# Patient Record
Sex: Female | Born: 1935 | Race: White | Hispanic: No | Smoking: Never smoker
Health system: Southern US, Community
[De-identification: ages and names within clinical notes are randomized; demographics above are authoritative.]

## PROBLEM LIST (undated history)

## (undated) DIAGNOSIS — D649 Anemia, unspecified: Secondary | ICD-10-CM

## (undated) DIAGNOSIS — K219 Gastro-esophageal reflux disease without esophagitis: Secondary | ICD-10-CM

## (undated) DIAGNOSIS — E785 Hyperlipidemia, unspecified: Secondary | ICD-10-CM

## (undated) DIAGNOSIS — F0390 Unspecified dementia without behavioral disturbance: Secondary | ICD-10-CM

---

## 2004-05-31 ENCOUNTER — Ambulatory Visit: Payer: Self-pay | Admitting: Internal Medicine

## 2006-06-06 ENCOUNTER — Inpatient Hospital Stay: Payer: Self-pay | Admitting: Internal Medicine

## 2006-07-18 ENCOUNTER — Other Ambulatory Visit: Payer: Self-pay

## 2006-07-18 ENCOUNTER — Inpatient Hospital Stay: Payer: Self-pay | Admitting: Internal Medicine

## 2010-06-30 DIAGNOSIS — G309 Alzheimer's disease, unspecified: Secondary | ICD-10-CM

## 2010-06-30 DIAGNOSIS — M130 Polyarthritis, unspecified: Secondary | ICD-10-CM

## 2010-06-30 DIAGNOSIS — F0280 Dementia in other diseases classified elsewhere without behavioral disturbance: Secondary | ICD-10-CM

## 2010-06-30 DIAGNOSIS — I2510 Atherosclerotic heart disease of native coronary artery without angina pectoris: Secondary | ICD-10-CM

## 2010-06-30 DIAGNOSIS — F411 Generalized anxiety disorder: Secondary | ICD-10-CM

## 2010-09-02 DIAGNOSIS — G309 Alzheimer's disease, unspecified: Secondary | ICD-10-CM

## 2010-09-02 DIAGNOSIS — M130 Polyarthritis, unspecified: Secondary | ICD-10-CM

## 2010-09-02 DIAGNOSIS — F0280 Dementia in other diseases classified elsewhere without behavioral disturbance: Secondary | ICD-10-CM

## 2010-09-02 DIAGNOSIS — F411 Generalized anxiety disorder: Secondary | ICD-10-CM

## 2010-09-02 DIAGNOSIS — I2510 Atherosclerotic heart disease of native coronary artery without angina pectoris: Secondary | ICD-10-CM

## 2010-09-27 DIAGNOSIS — F411 Generalized anxiety disorder: Secondary | ICD-10-CM

## 2010-09-27 DIAGNOSIS — M130 Polyarthritis, unspecified: Secondary | ICD-10-CM

## 2010-09-27 DIAGNOSIS — F0280 Dementia in other diseases classified elsewhere without behavioral disturbance: Secondary | ICD-10-CM

## 2010-09-27 DIAGNOSIS — I2510 Atherosclerotic heart disease of native coronary artery without angina pectoris: Secondary | ICD-10-CM

## 2010-09-27 DIAGNOSIS — G309 Alzheimer's disease, unspecified: Secondary | ICD-10-CM

## 2010-10-11 DIAGNOSIS — J069 Acute upper respiratory infection, unspecified: Secondary | ICD-10-CM

## 2010-11-10 DIAGNOSIS — M1990 Unspecified osteoarthritis, unspecified site: Secondary | ICD-10-CM

## 2010-11-23 ENCOUNTER — Ambulatory Visit: Payer: Self-pay | Admitting: Internal Medicine

## 2010-12-09 DIAGNOSIS — F411 Generalized anxiety disorder: Secondary | ICD-10-CM

## 2010-12-09 DIAGNOSIS — M159 Polyosteoarthritis, unspecified: Secondary | ICD-10-CM

## 2010-12-09 DIAGNOSIS — G309 Alzheimer's disease, unspecified: Secondary | ICD-10-CM

## 2010-12-09 DIAGNOSIS — I2510 Atherosclerotic heart disease of native coronary artery without angina pectoris: Secondary | ICD-10-CM

## 2010-12-09 DIAGNOSIS — F0280 Dementia in other diseases classified elsewhere without behavioral disturbance: Secondary | ICD-10-CM

## 2011-01-31 DIAGNOSIS — I2510 Atherosclerotic heart disease of native coronary artery without angina pectoris: Secondary | ICD-10-CM

## 2011-01-31 DIAGNOSIS — G309 Alzheimer's disease, unspecified: Secondary | ICD-10-CM

## 2011-01-31 DIAGNOSIS — M159 Polyosteoarthritis, unspecified: Secondary | ICD-10-CM

## 2011-01-31 DIAGNOSIS — F0280 Dementia in other diseases classified elsewhere without behavioral disturbance: Secondary | ICD-10-CM

## 2011-01-31 DIAGNOSIS — F411 Generalized anxiety disorder: Secondary | ICD-10-CM

## 2011-03-24 DIAGNOSIS — F0280 Dementia in other diseases classified elsewhere without behavioral disturbance: Secondary | ICD-10-CM

## 2011-03-24 DIAGNOSIS — M159 Polyosteoarthritis, unspecified: Secondary | ICD-10-CM

## 2011-03-24 DIAGNOSIS — G309 Alzheimer's disease, unspecified: Secondary | ICD-10-CM

## 2011-03-24 DIAGNOSIS — F411 Generalized anxiety disorder: Secondary | ICD-10-CM

## 2011-03-24 DIAGNOSIS — I2510 Atherosclerotic heart disease of native coronary artery without angina pectoris: Secondary | ICD-10-CM

## 2011-06-02 DIAGNOSIS — F411 Generalized anxiety disorder: Secondary | ICD-10-CM | POA: Diagnosis not present

## 2011-06-02 DIAGNOSIS — F0280 Dementia in other diseases classified elsewhere without behavioral disturbance: Secondary | ICD-10-CM | POA: Diagnosis not present

## 2011-06-02 DIAGNOSIS — M159 Polyosteoarthritis, unspecified: Secondary | ICD-10-CM

## 2011-06-02 DIAGNOSIS — I2510 Atherosclerotic heart disease of native coronary artery without angina pectoris: Secondary | ICD-10-CM

## 2011-06-02 DIAGNOSIS — G309 Alzheimer's disease, unspecified: Secondary | ICD-10-CM

## 2011-07-25 DIAGNOSIS — I2510 Atherosclerotic heart disease of native coronary artery without angina pectoris: Secondary | ICD-10-CM

## 2011-07-25 DIAGNOSIS — M159 Polyosteoarthritis, unspecified: Secondary | ICD-10-CM | POA: Diagnosis not present

## 2011-07-25 DIAGNOSIS — F0280 Dementia in other diseases classified elsewhere without behavioral disturbance: Secondary | ICD-10-CM

## 2011-07-25 DIAGNOSIS — G309 Alzheimer's disease, unspecified: Secondary | ICD-10-CM

## 2011-07-25 DIAGNOSIS — F411 Generalized anxiety disorder: Secondary | ICD-10-CM | POA: Diagnosis not present

## 2011-08-25 DIAGNOSIS — E785 Hyperlipidemia, unspecified: Secondary | ICD-10-CM | POA: Diagnosis not present

## 2011-10-13 DIAGNOSIS — G309 Alzheimer's disease, unspecified: Secondary | ICD-10-CM | POA: Diagnosis not present

## 2011-10-13 DIAGNOSIS — M159 Polyosteoarthritis, unspecified: Secondary | ICD-10-CM | POA: Diagnosis not present

## 2011-10-13 DIAGNOSIS — F0280 Dementia in other diseases classified elsewhere without behavioral disturbance: Secondary | ICD-10-CM | POA: Diagnosis not present

## 2011-10-13 DIAGNOSIS — F411 Generalized anxiety disorder: Secondary | ICD-10-CM

## 2011-10-13 DIAGNOSIS — I2510 Atherosclerotic heart disease of native coronary artery without angina pectoris: Secondary | ICD-10-CM

## 2011-12-05 DIAGNOSIS — L03039 Cellulitis of unspecified toe: Secondary | ICD-10-CM | POA: Diagnosis not present

## 2012-01-23 DIAGNOSIS — G309 Alzheimer's disease, unspecified: Secondary | ICD-10-CM | POA: Diagnosis not present

## 2012-01-23 DIAGNOSIS — F0280 Dementia in other diseases classified elsewhere without behavioral disturbance: Secondary | ICD-10-CM | POA: Diagnosis not present

## 2012-01-23 DIAGNOSIS — I2510 Atherosclerotic heart disease of native coronary artery without angina pectoris: Secondary | ICD-10-CM | POA: Diagnosis not present

## 2012-01-23 DIAGNOSIS — M159 Polyosteoarthritis, unspecified: Secondary | ICD-10-CM | POA: Diagnosis not present

## 2012-01-23 DIAGNOSIS — F411 Generalized anxiety disorder: Secondary | ICD-10-CM

## 2012-02-16 DIAGNOSIS — R300 Dysuria: Secondary | ICD-10-CM | POA: Diagnosis not present

## 2012-02-16 DIAGNOSIS — R4182 Altered mental status, unspecified: Secondary | ICD-10-CM | POA: Diagnosis not present

## 2012-02-23 DIAGNOSIS — E785 Hyperlipidemia, unspecified: Secondary | ICD-10-CM | POA: Diagnosis not present

## 2012-02-23 DIAGNOSIS — D649 Anemia, unspecified: Secondary | ICD-10-CM | POA: Diagnosis not present

## 2012-04-05 DIAGNOSIS — G309 Alzheimer's disease, unspecified: Secondary | ICD-10-CM | POA: Diagnosis not present

## 2012-04-05 DIAGNOSIS — M159 Polyosteoarthritis, unspecified: Secondary | ICD-10-CM

## 2012-04-05 DIAGNOSIS — F411 Generalized anxiety disorder: Secondary | ICD-10-CM | POA: Diagnosis not present

## 2012-04-05 DIAGNOSIS — I2510 Atherosclerotic heart disease of native coronary artery without angina pectoris: Secondary | ICD-10-CM | POA: Diagnosis not present

## 2012-04-05 DIAGNOSIS — F0280 Dementia in other diseases classified elsewhere without behavioral disturbance: Secondary | ICD-10-CM

## 2012-04-23 ENCOUNTER — Telehealth: Payer: Self-pay | Admitting: Internal Medicine

## 2012-04-23 NOTE — Telephone Encounter (Signed)
[facility] Triage Call Report Triage Record Num: [id number] Operator: Connye Burkitt Patient Name: Morgan Escobar Call Date & Time: 04/21/2012 11:15:03AM Patient Phone: [phone] PCP: Tillman Abide Patient Gender: Female PCP Fax : [phone] Patient DOB: 1927/08/06 Practice Name: [facility] Reason for Call: Caller: Teresa/RN; PCP: Tillman Abide (Family Practice); CB#: [phone]; Call regarding Cough; Afebrile. Lungs bilaterally have "rub sound" top to bottom, afebrile, pulse ox 97%, no o2. ADL's not effected. Cough intermittent and wet. Last Sunday, coughed hard and was not eating at the time, hoarse for about 2 hours. Protocol(s) Used: Breathing Problems Protocol(s) Used: Cough - Adult Recommended Outcome per Protocol: See Provider within 24 hours Reason for Outcome: Cough without breathing difficulty Gradual onset of cough when lying down AND relieved after being in a sitting or standing position Care Advice: ~ 04/21/2012 11:41:38AM Page 1 of 1 CAN_TriageRpt_V2

## 2012-04-23 NOTE — Telephone Encounter (Signed)
[facility] Triage Call Report Triage Record Num: [id number] Operator: Connye Burkitt Patient Name: Morgan Escobar Call Date & Time: 04/21/2012 11:50:21AM Patient Phone: [phone] PCP: Tillman Abide Patient Gender: Female PCP Fax : [phone] Patient DOB: Sep 15, 1927 Practice Name[facility] Reason for Call: Caller: Teresa/RN; PCP: Tillman Abide (Family Practice); CB#: [phone]; Call regarding Cough; Addendum to earlier 04/21/2012 call @ 11:15, computer issue and RN/CAN was knocked off the system and needed IT help from [employer]. Back on line, tried Calling 2x to continue triage and also tried [phone], they advised trying [phone], reached Aggie Cosier, RN successfully. All emergent signs and symptoms of cough - Adult protocal ruled out except 'Recurring cough AND known or suspected gastric reflux (heartburn, sour or acid fluid or food from stomach into throat/mouth'. Home Care advice given and instructed Aggie Cosier, RN to call back with any respiratory issues, she stated she will and Dr. will be into see her Monday, 04/23/2012. Protocol(s) Used: Cough - Adult Recommended Outcome per Protocol: See Provider within 72 Hours Reason for Outcome: Recurring cough AND known or suspected gastric reflux (heartburn, sour or acid fluid or food from stomach into throat/mouth) Care Advice: ~ Avoid fatty, fried, spicy, or gas-producing foods. ~ Avoid drinking alcohol. ~ Avoid eating salty, spicy, or acidic foods. ~ Eat smaller, more frequent meals; eat slowly and allow one-half hour to relax after eating. ~ If you can, stop smoking now and avoid all secondhand smoke. ~ Call provider if symptoms continue despite home care. ~ SYMPTOM / CONDITION MANAGEMENT ~ NUTRITION / HYDRATION ~ CAUTIONS Consider nonprescription low-sodium antacids (i.e. Mylanta, Maalox, Tums, Gelusil), H-2-receptor blockers (Tagamet HB, Pepcid AC, Zantac), or a proton pump inhibitor (Prilosec) following  package directions or provider instructions. ~ 04/21/2012 12:04:47PM Page 1 of 1 CAN_TriageRpt_V2

## 2012-04-23 NOTE — Telephone Encounter (Signed)
Reviewed at [facility] today She is doing better

## 2012-05-24 DIAGNOSIS — M159 Polyosteoarthritis, unspecified: Secondary | ICD-10-CM | POA: Diagnosis not present

## 2012-05-24 DIAGNOSIS — G309 Alzheimer's disease, unspecified: Secondary | ICD-10-CM

## 2012-05-24 DIAGNOSIS — I2510 Atherosclerotic heart disease of native coronary artery without angina pectoris: Secondary | ICD-10-CM

## 2012-05-24 DIAGNOSIS — F411 Generalized anxiety disorder: Secondary | ICD-10-CM | POA: Diagnosis not present

## 2012-05-24 DIAGNOSIS — F0280 Dementia in other diseases classified elsewhere without behavioral disturbance: Secondary | ICD-10-CM | POA: Diagnosis not present

## 2012-07-19 DIAGNOSIS — I2510 Atherosclerotic heart disease of native coronary artery without angina pectoris: Secondary | ICD-10-CM | POA: Diagnosis not present

## 2012-07-19 DIAGNOSIS — M159 Polyosteoarthritis, unspecified: Secondary | ICD-10-CM

## 2012-07-19 DIAGNOSIS — F0280 Dementia in other diseases classified elsewhere without behavioral disturbance: Secondary | ICD-10-CM | POA: Diagnosis not present

## 2012-07-19 DIAGNOSIS — G309 Alzheimer's disease, unspecified: Secondary | ICD-10-CM | POA: Diagnosis not present

## 2012-07-19 DIAGNOSIS — F411 Generalized anxiety disorder: Secondary | ICD-10-CM | POA: Diagnosis not present

## 2012-08-23 DIAGNOSIS — E785 Hyperlipidemia, unspecified: Secondary | ICD-10-CM | POA: Diagnosis not present

## 2012-09-27 DIAGNOSIS — I2510 Atherosclerotic heart disease of native coronary artery without angina pectoris: Secondary | ICD-10-CM

## 2012-09-27 DIAGNOSIS — F411 Generalized anxiety disorder: Secondary | ICD-10-CM

## 2012-09-27 DIAGNOSIS — F0280 Dementia in other diseases classified elsewhere without behavioral disturbance: Secondary | ICD-10-CM | POA: Diagnosis not present

## 2012-09-27 DIAGNOSIS — G309 Alzheimer's disease, unspecified: Secondary | ICD-10-CM

## 2012-09-27 DIAGNOSIS — M159 Polyosteoarthritis, unspecified: Secondary | ICD-10-CM | POA: Diagnosis not present

## 2012-11-29 DIAGNOSIS — F411 Generalized anxiety disorder: Secondary | ICD-10-CM

## 2012-11-29 DIAGNOSIS — G309 Alzheimer's disease, unspecified: Secondary | ICD-10-CM | POA: Diagnosis not present

## 2012-11-29 DIAGNOSIS — F0280 Dementia in other diseases classified elsewhere without behavioral disturbance: Secondary | ICD-10-CM

## 2012-11-29 DIAGNOSIS — S60229A Contusion of unspecified hand, initial encounter: Secondary | ICD-10-CM | POA: Diagnosis not present

## 2012-11-29 DIAGNOSIS — M159 Polyosteoarthritis, unspecified: Secondary | ICD-10-CM | POA: Diagnosis not present

## 2013-01-23 DIAGNOSIS — F0280 Dementia in other diseases classified elsewhere without behavioral disturbance: Secondary | ICD-10-CM

## 2013-01-23 DIAGNOSIS — I2510 Atherosclerotic heart disease of native coronary artery without angina pectoris: Secondary | ICD-10-CM

## 2013-01-23 DIAGNOSIS — M1990 Unspecified osteoarthritis, unspecified site: Secondary | ICD-10-CM

## 2013-01-23 DIAGNOSIS — G309 Alzheimer's disease, unspecified: Secondary | ICD-10-CM

## 2013-01-23 DIAGNOSIS — K5900 Constipation, unspecified: Secondary | ICD-10-CM

## 2013-01-23 DIAGNOSIS — F3289 Other specified depressive episodes: Secondary | ICD-10-CM

## 2013-01-23 DIAGNOSIS — F411 Generalized anxiety disorder: Secondary | ICD-10-CM

## 2013-01-23 DIAGNOSIS — F329 Major depressive disorder, single episode, unspecified: Secondary | ICD-10-CM | POA: Diagnosis not present

## 2013-02-28 DIAGNOSIS — D649 Anemia, unspecified: Secondary | ICD-10-CM | POA: Diagnosis not present

## 2013-02-28 DIAGNOSIS — E785 Hyperlipidemia, unspecified: Secondary | ICD-10-CM | POA: Diagnosis not present

## 2013-03-27 DIAGNOSIS — F411 Generalized anxiety disorder: Secondary | ICD-10-CM | POA: Diagnosis not present

## 2013-03-27 DIAGNOSIS — G309 Alzheimer's disease, unspecified: Secondary | ICD-10-CM

## 2013-03-27 DIAGNOSIS — I2510 Atherosclerotic heart disease of native coronary artery without angina pectoris: Secondary | ICD-10-CM

## 2013-03-27 DIAGNOSIS — F0280 Dementia in other diseases classified elsewhere without behavioral disturbance: Secondary | ICD-10-CM | POA: Diagnosis not present

## 2013-03-27 DIAGNOSIS — M1710 Unilateral primary osteoarthritis, unspecified knee: Secondary | ICD-10-CM | POA: Diagnosis not present

## 2013-03-27 DIAGNOSIS — K5900 Constipation, unspecified: Secondary | ICD-10-CM

## 2013-05-29 DIAGNOSIS — F411 Generalized anxiety disorder: Secondary | ICD-10-CM | POA: Diagnosis not present

## 2013-05-29 DIAGNOSIS — I2510 Atherosclerotic heart disease of native coronary artery without angina pectoris: Secondary | ICD-10-CM

## 2013-05-29 DIAGNOSIS — IMO0002 Reserved for concepts with insufficient information to code with codable children: Secondary | ICD-10-CM

## 2013-05-29 DIAGNOSIS — M1710 Unilateral primary osteoarthritis, unspecified knee: Secondary | ICD-10-CM

## 2013-05-29 DIAGNOSIS — K5900 Constipation, unspecified: Secondary | ICD-10-CM

## 2013-05-29 DIAGNOSIS — G309 Alzheimer's disease, unspecified: Secondary | ICD-10-CM

## 2013-05-29 DIAGNOSIS — F0280 Dementia in other diseases classified elsewhere without behavioral disturbance: Secondary | ICD-10-CM | POA: Diagnosis not present

## 2013-07-23 DIAGNOSIS — IMO0002 Reserved for concepts with insufficient information to code with codable children: Secondary | ICD-10-CM | POA: Diagnosis not present

## 2013-07-23 DIAGNOSIS — K5900 Constipation, unspecified: Secondary | ICD-10-CM | POA: Diagnosis not present

## 2013-07-23 DIAGNOSIS — M1710 Unilateral primary osteoarthritis, unspecified knee: Secondary | ICD-10-CM | POA: Diagnosis not present

## 2013-07-23 DIAGNOSIS — G309 Alzheimer's disease, unspecified: Secondary | ICD-10-CM

## 2013-07-23 DIAGNOSIS — F411 Generalized anxiety disorder: Secondary | ICD-10-CM

## 2013-07-23 DIAGNOSIS — F0280 Dementia in other diseases classified elsewhere without behavioral disturbance: Secondary | ICD-10-CM | POA: Diagnosis not present

## 2013-07-23 DIAGNOSIS — I2510 Atherosclerotic heart disease of native coronary artery without angina pectoris: Secondary | ICD-10-CM

## 2013-08-16 DIAGNOSIS — M1990 Unspecified osteoarthritis, unspecified site: Secondary | ICD-10-CM | POA: Diagnosis not present

## 2013-08-19 DIAGNOSIS — E785 Hyperlipidemia, unspecified: Secondary | ICD-10-CM | POA: Diagnosis not present

## 2013-09-26 DIAGNOSIS — Z79899 Other long term (current) drug therapy: Secondary | ICD-10-CM | POA: Diagnosis not present

## 2013-09-27 DIAGNOSIS — M1990 Unspecified osteoarthritis, unspecified site: Secondary | ICD-10-CM | POA: Diagnosis not present

## 2013-09-27 DIAGNOSIS — D649 Anemia, unspecified: Secondary | ICD-10-CM | POA: Diagnosis not present

## 2013-10-01 DIAGNOSIS — F0280 Dementia in other diseases classified elsewhere without behavioral disturbance: Secondary | ICD-10-CM

## 2013-10-01 DIAGNOSIS — IMO0002 Reserved for concepts with insufficient information to code with codable children: Secondary | ICD-10-CM

## 2013-10-01 DIAGNOSIS — R609 Edema, unspecified: Secondary | ICD-10-CM | POA: Diagnosis not present

## 2013-10-01 DIAGNOSIS — I2510 Atherosclerotic heart disease of native coronary artery without angina pectoris: Secondary | ICD-10-CM | POA: Diagnosis not present

## 2013-10-01 DIAGNOSIS — G309 Alzheimer's disease, unspecified: Secondary | ICD-10-CM

## 2013-10-01 DIAGNOSIS — R110 Nausea: Secondary | ICD-10-CM | POA: Diagnosis not present

## 2013-10-01 DIAGNOSIS — M1710 Unilateral primary osteoarthritis, unspecified knee: Secondary | ICD-10-CM | POA: Diagnosis not present

## 2013-10-01 DIAGNOSIS — F411 Generalized anxiety disorder: Secondary | ICD-10-CM

## 2013-10-07 DIAGNOSIS — D649 Anemia, unspecified: Secondary | ICD-10-CM | POA: Diagnosis not present

## 2013-10-09 DIAGNOSIS — D500 Iron deficiency anemia secondary to blood loss (chronic): Secondary | ICD-10-CM

## 2013-11-18 DIAGNOSIS — Z79899 Other long term (current) drug therapy: Secondary | ICD-10-CM | POA: Diagnosis not present

## 2013-11-27 DIAGNOSIS — F0280 Dementia in other diseases classified elsewhere without behavioral disturbance: Secondary | ICD-10-CM

## 2013-11-27 DIAGNOSIS — R1110 Vomiting, unspecified: Secondary | ICD-10-CM | POA: Diagnosis not present

## 2013-11-27 DIAGNOSIS — K5900 Constipation, unspecified: Secondary | ICD-10-CM | POA: Diagnosis not present

## 2013-11-27 DIAGNOSIS — G309 Alzheimer's disease, unspecified: Secondary | ICD-10-CM

## 2013-11-27 DIAGNOSIS — F411 Generalized anxiety disorder: Secondary | ICD-10-CM

## 2013-11-27 DIAGNOSIS — IMO0002 Reserved for concepts with insufficient information to code with codable children: Secondary | ICD-10-CM | POA: Diagnosis not present

## 2013-11-27 DIAGNOSIS — M1710 Unilateral primary osteoarthritis, unspecified knee: Secondary | ICD-10-CM

## 2013-11-27 DIAGNOSIS — I2510 Atherosclerotic heart disease of native coronary artery without angina pectoris: Secondary | ICD-10-CM | POA: Diagnosis not present

## 2014-01-18 ENCOUNTER — Emergency Department: Payer: Self-pay | Admitting: Emergency Medicine

## 2014-01-18 DIAGNOSIS — R531 Weakness: Secondary | ICD-10-CM | POA: Diagnosis not present

## 2014-01-18 DIAGNOSIS — F0390 Unspecified dementia without behavioral disturbance: Secondary | ICD-10-CM | POA: Diagnosis not present

## 2014-01-18 DIAGNOSIS — J918 Pleural effusion in other conditions classified elsewhere: Secondary | ICD-10-CM | POA: Diagnosis not present

## 2014-01-18 DIAGNOSIS — R9431 Abnormal electrocardiogram [ECG] [EKG]: Secondary | ICD-10-CM | POA: Diagnosis not present

## 2014-01-18 DIAGNOSIS — N390 Urinary tract infection, site not specified: Secondary | ICD-10-CM | POA: Diagnosis not present

## 2014-01-18 LAB — URINALYSIS, COMPLETE
GLUCOSE, UR: NEGATIVE mg/dL (ref 0–75)
Hyaline Cast: 31
NITRITE: NEGATIVE
Ph: 5 (ref 4.5–8.0)
RBC,UR: 14 /HPF (ref 0–5)
SPECIFIC GRAVITY: 1.025 (ref 1.003–1.030)
Squamous Epithelial: 5
WBC UR: 276 /HPF (ref 0–5)

## 2014-01-18 LAB — COMPREHENSIVE METABOLIC PANEL
ALK PHOS: 201 U/L — AB
ALT: 31 U/L
Albumin: 3.2 g/dL — ABNORMAL LOW (ref 3.4–5.0)
Anion Gap: 8 (ref 7–16)
BILIRUBIN TOTAL: 0.3 mg/dL (ref 0.2–1.0)
BUN: 15 mg/dL (ref 7–18)
CHLORIDE: 106 mmol/L (ref 98–107)
Calcium, Total: 8.4 mg/dL — ABNORMAL LOW (ref 8.5–10.1)
Co2: 26 mmol/L (ref 21–32)
Creatinine: 1.34 mg/dL — ABNORMAL HIGH (ref 0.60–1.30)
GFR CALC AF AMER: 49 — AB
GFR CALC NON AF AMER: 40 — AB
Glucose: 96 mg/dL (ref 65–99)
Osmolality: 280 (ref 275–301)
Potassium: 4.1 mmol/L (ref 3.5–5.1)
SGOT(AST): 32 U/L (ref 15–37)
Sodium: 140 mmol/L (ref 136–145)
TOTAL PROTEIN: 7.4 g/dL (ref 6.4–8.2)

## 2014-01-18 LAB — CBC WITH DIFFERENTIAL/PLATELET
BASOS ABS: 0 10*3/uL (ref 0.0–0.1)
BASOS PCT: 0.5 %
EOS ABS: 0.1 10*3/uL (ref 0.0–0.7)
Eosinophil %: 1.7 %
HCT: 41.5 % (ref 35.0–47.0)
HGB: 12.9 g/dL (ref 12.0–16.0)
LYMPHS ABS: 0.9 10*3/uL — AB (ref 1.0–3.6)
Lymphocyte %: 11.1 %
MCH: 27 pg (ref 26.0–34.0)
MCHC: 31.2 g/dL — ABNORMAL LOW (ref 32.0–36.0)
MCV: 87 fL (ref 80–100)
MONOS PCT: 8.5 %
Monocyte #: 0.7 x10 3/mm (ref 0.2–0.9)
NEUTROS ABS: 6.4 10*3/uL (ref 1.4–6.5)
Neutrophil %: 78.2 %
PLATELETS: 216 10*3/uL (ref 150–440)
RBC: 4.79 10*6/uL (ref 3.80–5.20)
RDW: 15.3 % — ABNORMAL HIGH (ref 11.5–14.5)
WBC: 8.2 10*3/uL (ref 3.6–11.0)

## 2014-01-18 LAB — PRO B NATRIURETIC PEPTIDE: B-TYPE NATIURETIC PEPTID: 852 pg/mL — AB (ref 0–450)

## 2014-01-18 LAB — TROPONIN I: Troponin-I: <0.02 ng/mL

## 2014-01-20 LAB — URINE CULTURE

## 2014-01-27 DIAGNOSIS — M1990 Unspecified osteoarthritis, unspecified site: Secondary | ICD-10-CM | POA: Diagnosis not present

## 2014-01-27 DIAGNOSIS — K5900 Constipation, unspecified: Secondary | ICD-10-CM | POA: Diagnosis not present

## 2014-01-27 DIAGNOSIS — R110 Nausea: Secondary | ICD-10-CM

## 2014-01-27 DIAGNOSIS — F419 Anxiety disorder, unspecified: Secondary | ICD-10-CM

## 2014-01-27 DIAGNOSIS — I2510 Atherosclerotic heart disease of native coronary artery without angina pectoris: Secondary | ICD-10-CM | POA: Diagnosis not present

## 2014-01-27 DIAGNOSIS — G309 Alzheimer's disease, unspecified: Secondary | ICD-10-CM

## 2014-02-17 DIAGNOSIS — E785 Hyperlipidemia, unspecified: Secondary | ICD-10-CM | POA: Diagnosis not present

## 2014-02-17 DIAGNOSIS — D649 Anemia, unspecified: Secondary | ICD-10-CM | POA: Diagnosis not present

## 2014-02-25 DIAGNOSIS — M6281 Muscle weakness (generalized): Secondary | ICD-10-CM | POA: Diagnosis not present

## 2014-02-26 DIAGNOSIS — M6281 Muscle weakness (generalized): Secondary | ICD-10-CM | POA: Diagnosis not present

## 2014-02-28 DIAGNOSIS — M6281 Muscle weakness (generalized): Secondary | ICD-10-CM | POA: Diagnosis not present

## 2014-03-04 DIAGNOSIS — M6281 Muscle weakness (generalized): Secondary | ICD-10-CM | POA: Diagnosis not present

## 2014-03-05 DIAGNOSIS — M6281 Muscle weakness (generalized): Secondary | ICD-10-CM | POA: Diagnosis not present

## 2014-03-07 DIAGNOSIS — M6281 Muscle weakness (generalized): Secondary | ICD-10-CM | POA: Diagnosis not present

## 2014-03-11 DIAGNOSIS — M6281 Muscle weakness (generalized): Secondary | ICD-10-CM | POA: Diagnosis not present

## 2014-03-12 DIAGNOSIS — M6281 Muscle weakness (generalized): Secondary | ICD-10-CM | POA: Diagnosis not present

## 2014-03-14 DIAGNOSIS — M6281 Muscle weakness (generalized): Secondary | ICD-10-CM | POA: Diagnosis not present

## 2014-03-19 DIAGNOSIS — M6281 Muscle weakness (generalized): Secondary | ICD-10-CM | POA: Diagnosis not present

## 2014-03-20 DIAGNOSIS — M6281 Muscle weakness (generalized): Secondary | ICD-10-CM | POA: Diagnosis not present

## 2014-03-26 DIAGNOSIS — G309 Alzheimer's disease, unspecified: Secondary | ICD-10-CM

## 2014-03-26 DIAGNOSIS — K219 Gastro-esophageal reflux disease without esophagitis: Secondary | ICD-10-CM

## 2014-03-26 DIAGNOSIS — K5900 Constipation, unspecified: Secondary | ICD-10-CM | POA: Diagnosis not present

## 2014-03-26 DIAGNOSIS — I2510 Atherosclerotic heart disease of native coronary artery without angina pectoris: Secondary | ICD-10-CM | POA: Diagnosis not present

## 2014-03-26 DIAGNOSIS — M1990 Unspecified osteoarthritis, unspecified site: Secondary | ICD-10-CM | POA: Diagnosis not present

## 2014-03-26 DIAGNOSIS — F419 Anxiety disorder, unspecified: Secondary | ICD-10-CM | POA: Diagnosis not present

## 2014-05-27 DIAGNOSIS — M6281 Muscle weakness (generalized): Secondary | ICD-10-CM | POA: Diagnosis not present

## 2014-05-27 DIAGNOSIS — R262 Difficulty in walking, not elsewhere classified: Secondary | ICD-10-CM | POA: Diagnosis not present

## 2014-05-28 DIAGNOSIS — R262 Difficulty in walking, not elsewhere classified: Secondary | ICD-10-CM | POA: Diagnosis not present

## 2014-05-28 DIAGNOSIS — M6281 Muscle weakness (generalized): Secondary | ICD-10-CM | POA: Diagnosis not present

## 2014-05-29 DIAGNOSIS — M6281 Muscle weakness (generalized): Secondary | ICD-10-CM | POA: Diagnosis not present

## 2014-05-29 DIAGNOSIS — R262 Difficulty in walking, not elsewhere classified: Secondary | ICD-10-CM | POA: Diagnosis not present

## 2014-05-30 DIAGNOSIS — R262 Difficulty in walking, not elsewhere classified: Secondary | ICD-10-CM | POA: Diagnosis not present

## 2014-05-30 DIAGNOSIS — M6281 Muscle weakness (generalized): Secondary | ICD-10-CM | POA: Diagnosis not present

## 2014-06-02 DIAGNOSIS — R262 Difficulty in walking, not elsewhere classified: Secondary | ICD-10-CM | POA: Diagnosis not present

## 2014-06-02 DIAGNOSIS — M6281 Muscle weakness (generalized): Secondary | ICD-10-CM | POA: Diagnosis not present

## 2014-06-03 DIAGNOSIS — R262 Difficulty in walking, not elsewhere classified: Secondary | ICD-10-CM | POA: Diagnosis not present

## 2014-06-03 DIAGNOSIS — M6281 Muscle weakness (generalized): Secondary | ICD-10-CM | POA: Diagnosis not present

## 2014-06-04 DIAGNOSIS — I2510 Atherosclerotic heart disease of native coronary artery without angina pectoris: Secondary | ICD-10-CM | POA: Diagnosis not present

## 2014-06-04 DIAGNOSIS — M1990 Unspecified osteoarthritis, unspecified site: Secondary | ICD-10-CM

## 2014-06-04 DIAGNOSIS — R262 Difficulty in walking, not elsewhere classified: Secondary | ICD-10-CM | POA: Diagnosis not present

## 2014-06-04 DIAGNOSIS — K5900 Constipation, unspecified: Secondary | ICD-10-CM | POA: Diagnosis not present

## 2014-06-04 DIAGNOSIS — G309 Alzheimer's disease, unspecified: Secondary | ICD-10-CM | POA: Diagnosis not present

## 2014-06-04 DIAGNOSIS — M6281 Muscle weakness (generalized): Secondary | ICD-10-CM | POA: Diagnosis not present

## 2014-06-04 DIAGNOSIS — K219 Gastro-esophageal reflux disease without esophagitis: Secondary | ICD-10-CM | POA: Diagnosis not present

## 2014-06-04 DIAGNOSIS — F419 Anxiety disorder, unspecified: Secondary | ICD-10-CM | POA: Diagnosis not present

## 2014-06-05 DIAGNOSIS — R262 Difficulty in walking, not elsewhere classified: Secondary | ICD-10-CM | POA: Diagnosis not present

## 2014-06-05 DIAGNOSIS — M6281 Muscle weakness (generalized): Secondary | ICD-10-CM | POA: Diagnosis not present

## 2014-06-06 DIAGNOSIS — R262 Difficulty in walking, not elsewhere classified: Secondary | ICD-10-CM | POA: Diagnosis not present

## 2014-06-06 DIAGNOSIS — M6281 Muscle weakness (generalized): Secondary | ICD-10-CM | POA: Diagnosis not present

## 2014-06-09 DIAGNOSIS — R262 Difficulty in walking, not elsewhere classified: Secondary | ICD-10-CM | POA: Diagnosis not present

## 2014-06-09 DIAGNOSIS — M6281 Muscle weakness (generalized): Secondary | ICD-10-CM | POA: Diagnosis not present

## 2014-06-10 DIAGNOSIS — M6281 Muscle weakness (generalized): Secondary | ICD-10-CM | POA: Diagnosis not present

## 2014-06-10 DIAGNOSIS — R262 Difficulty in walking, not elsewhere classified: Secondary | ICD-10-CM | POA: Diagnosis not present

## 2014-06-11 DIAGNOSIS — R262 Difficulty in walking, not elsewhere classified: Secondary | ICD-10-CM | POA: Diagnosis not present

## 2014-06-11 DIAGNOSIS — M6281 Muscle weakness (generalized): Secondary | ICD-10-CM | POA: Diagnosis not present

## 2014-06-12 DIAGNOSIS — M6281 Muscle weakness (generalized): Secondary | ICD-10-CM | POA: Diagnosis not present

## 2014-06-12 DIAGNOSIS — R262 Difficulty in walking, not elsewhere classified: Secondary | ICD-10-CM | POA: Diagnosis not present

## 2014-06-13 DIAGNOSIS — R262 Difficulty in walking, not elsewhere classified: Secondary | ICD-10-CM | POA: Diagnosis not present

## 2014-06-13 DIAGNOSIS — M6281 Muscle weakness (generalized): Secondary | ICD-10-CM | POA: Diagnosis not present

## 2014-08-08 DIAGNOSIS — K219 Gastro-esophageal reflux disease without esophagitis: Secondary | ICD-10-CM | POA: Diagnosis not present

## 2014-08-08 DIAGNOSIS — G309 Alzheimer's disease, unspecified: Secondary | ICD-10-CM

## 2014-08-08 DIAGNOSIS — K5900 Constipation, unspecified: Secondary | ICD-10-CM | POA: Diagnosis not present

## 2014-08-08 DIAGNOSIS — F419 Anxiety disorder, unspecified: Secondary | ICD-10-CM

## 2014-08-08 DIAGNOSIS — I2510 Atherosclerotic heart disease of native coronary artery without angina pectoris: Secondary | ICD-10-CM | POA: Diagnosis not present

## 2014-08-08 DIAGNOSIS — M1990 Unspecified osteoarthritis, unspecified site: Secondary | ICD-10-CM | POA: Diagnosis not present

## 2014-08-18 DIAGNOSIS — E785 Hyperlipidemia, unspecified: Secondary | ICD-10-CM | POA: Diagnosis not present

## 2014-09-19 DIAGNOSIS — F419 Anxiety disorder, unspecified: Secondary | ICD-10-CM

## 2014-09-19 DIAGNOSIS — I2510 Atherosclerotic heart disease of native coronary artery without angina pectoris: Secondary | ICD-10-CM | POA: Diagnosis not present

## 2014-09-19 DIAGNOSIS — K5900 Constipation, unspecified: Secondary | ICD-10-CM

## 2014-09-19 DIAGNOSIS — M1990 Unspecified osteoarthritis, unspecified site: Secondary | ICD-10-CM | POA: Diagnosis not present

## 2014-09-19 DIAGNOSIS — G309 Alzheimer's disease, unspecified: Secondary | ICD-10-CM | POA: Diagnosis not present

## 2014-09-19 DIAGNOSIS — K219 Gastro-esophageal reflux disease without esophagitis: Secondary | ICD-10-CM | POA: Diagnosis not present

## 2014-11-26 DIAGNOSIS — M1990 Unspecified osteoarthritis, unspecified site: Secondary | ICD-10-CM | POA: Diagnosis not present

## 2014-11-26 DIAGNOSIS — K219 Gastro-esophageal reflux disease without esophagitis: Secondary | ICD-10-CM | POA: Diagnosis not present

## 2014-11-26 DIAGNOSIS — F419 Anxiety disorder, unspecified: Secondary | ICD-10-CM

## 2014-11-26 DIAGNOSIS — G309 Alzheimer's disease, unspecified: Secondary | ICD-10-CM

## 2014-11-26 DIAGNOSIS — I2510 Atherosclerotic heart disease of native coronary artery without angina pectoris: Secondary | ICD-10-CM | POA: Diagnosis not present

## 2014-11-26 DIAGNOSIS — K5900 Constipation, unspecified: Secondary | ICD-10-CM | POA: Diagnosis not present

## 2015-01-28 DIAGNOSIS — K219 Gastro-esophageal reflux disease without esophagitis: Secondary | ICD-10-CM | POA: Diagnosis not present

## 2015-01-28 DIAGNOSIS — I2510 Atherosclerotic heart disease of native coronary artery without angina pectoris: Secondary | ICD-10-CM | POA: Diagnosis not present

## 2015-01-28 DIAGNOSIS — M1990 Unspecified osteoarthritis, unspecified site: Secondary | ICD-10-CM | POA: Diagnosis not present

## 2015-01-28 DIAGNOSIS — G309 Alzheimer's disease, unspecified: Secondary | ICD-10-CM

## 2015-01-28 DIAGNOSIS — F419 Anxiety disorder, unspecified: Secondary | ICD-10-CM

## 2015-01-28 DIAGNOSIS — K5900 Constipation, unspecified: Secondary | ICD-10-CM | POA: Diagnosis not present

## 2015-02-23 DIAGNOSIS — E785 Hyperlipidemia, unspecified: Secondary | ICD-10-CM | POA: Diagnosis not present

## 2015-02-23 DIAGNOSIS — D649 Anemia, unspecified: Secondary | ICD-10-CM | POA: Diagnosis not present

## 2015-03-27 DIAGNOSIS — F419 Anxiety disorder, unspecified: Secondary | ICD-10-CM

## 2015-03-27 DIAGNOSIS — K5900 Constipation, unspecified: Secondary | ICD-10-CM

## 2015-03-27 DIAGNOSIS — K219 Gastro-esophageal reflux disease without esophagitis: Secondary | ICD-10-CM | POA: Diagnosis not present

## 2015-03-27 DIAGNOSIS — I2510 Atherosclerotic heart disease of native coronary artery without angina pectoris: Secondary | ICD-10-CM | POA: Diagnosis not present

## 2015-03-27 DIAGNOSIS — G309 Alzheimer's disease, unspecified: Secondary | ICD-10-CM | POA: Diagnosis not present

## 2015-03-27 DIAGNOSIS — E785 Hyperlipidemia, unspecified: Secondary | ICD-10-CM

## 2015-03-27 DIAGNOSIS — M1990 Unspecified osteoarthritis, unspecified site: Secondary | ICD-10-CM | POA: Diagnosis not present

## 2015-05-20 DIAGNOSIS — M1990 Unspecified osteoarthritis, unspecified site: Secondary | ICD-10-CM | POA: Diagnosis not present

## 2015-05-20 DIAGNOSIS — I2510 Atherosclerotic heart disease of native coronary artery without angina pectoris: Secondary | ICD-10-CM | POA: Diagnosis not present

## 2015-05-20 DIAGNOSIS — F419 Anxiety disorder, unspecified: Secondary | ICD-10-CM

## 2015-05-20 DIAGNOSIS — G309 Alzheimer's disease, unspecified: Secondary | ICD-10-CM | POA: Diagnosis not present

## 2015-05-20 DIAGNOSIS — K5900 Constipation, unspecified: Secondary | ICD-10-CM | POA: Diagnosis not present

## 2015-05-20 DIAGNOSIS — K219 Gastro-esophageal reflux disease without esophagitis: Secondary | ICD-10-CM

## 2015-06-08 ENCOUNTER — Telehealth: Payer: Self-pay

## 2015-06-08 NOTE — Telephone Encounter (Signed)
Treated empirically with Z-pak and flu Rx She is better today

## 2015-06-08 NOTE — Telephone Encounter (Signed)
PLEASE NOTE: All timestamps contained within this report are represented as [place] Standard Time. CONFIDENTIALTY NOTICE: This fax transmission is intended only for the addressee. It contains information that is legally privileged, confidential or otherwise protected from use or disclosure. If you are not the intended recipient, you are strictly prohibited from reviewing, disclosing, copying using or disseminating any of this information or taking any action in reliance on or regarding this information. If you have received this fax in error, please notify us immediately by telephone so that we can arrange for its return to [place]. Phone: [phone], Toll-Free: [phone], Fax: [phone] Page: 1 of 1 Call Id: [id number] Dozier Patient Name: Morgan Escobar Gender: Female DOB: [birth year] Age: 82 Y 3 M 13 D Return Phone Number: Address: City/State/Zip: [place] Client [place] Night - Client Client Site [place] Physician Viviana Simpler Contact Type Call Call Type Page Only Caller Name Helene Kelp w/ Memory Care at [facility] Relationship To Patient Provider Is this call to report lab results? No Return Phone Number Please choose phone number Initial Comment Caller states she needs to speak to the physician on call regarding patient. Patient has a cough, fever, and congestion. Fever is unresponsive to tylenol. The patients family wants to treat like flu since there is some patients in the facility with the flu. Helene Kelp w/ Memory [facility] at [place] Translation No Nurse Assessment Guidelines Guideline Title Affirmed Question Affirmed Notes Nurse Date/Time Eilene Ghazi Time) Disp. Time Eilene Ghazi Time) Disposition Final User 06/06/2015 1:25:46 PM Send to Ahtanum, Prentice 06/06/2015 1:44:29 PM Paged On Call to Other  Provider Alphonsa Overall 06/06/2015 1:44:53 PM Page Completed Yes Maryann Alar [facility] Phone DateTime Result/Outcome Message Type Notes Alysia Penna [id number] 06/06/2015 1:44:29 PM Paged On Call to Other Provider Doctor Paged Please call Helene Kelp w/ [facility] Care at [place], [place] 06/06/2015 1:44:47 PM Spoke with On Call - General Message Result

## 2015-08-12 DIAGNOSIS — M1990 Unspecified osteoarthritis, unspecified site: Secondary | ICD-10-CM | POA: Diagnosis not present

## 2015-08-12 DIAGNOSIS — F419 Anxiety disorder, unspecified: Secondary | ICD-10-CM

## 2015-08-12 DIAGNOSIS — K5900 Constipation, unspecified: Secondary | ICD-10-CM | POA: Diagnosis not present

## 2015-08-12 DIAGNOSIS — K219 Gastro-esophageal reflux disease without esophagitis: Secondary | ICD-10-CM

## 2015-08-12 DIAGNOSIS — I2510 Atherosclerotic heart disease of native coronary artery without angina pectoris: Secondary | ICD-10-CM | POA: Diagnosis not present

## 2015-08-12 DIAGNOSIS — G309 Alzheimer's disease, unspecified: Secondary | ICD-10-CM | POA: Diagnosis not present

## 2015-08-17 DIAGNOSIS — E785 Hyperlipidemia, unspecified: Secondary | ICD-10-CM | POA: Diagnosis not present

## 2015-09-07 IMAGING — CR DG CHEST 1V
1 series · 1 of 1 positions shown · non-contrast
Comparison: Portable chest x-ray July 18, 2006.

CLINICAL DATA: Increase sleep witness today with confusion ;
patient unable to give history

EXAM:
CHEST - 1 VIEW

[ap]
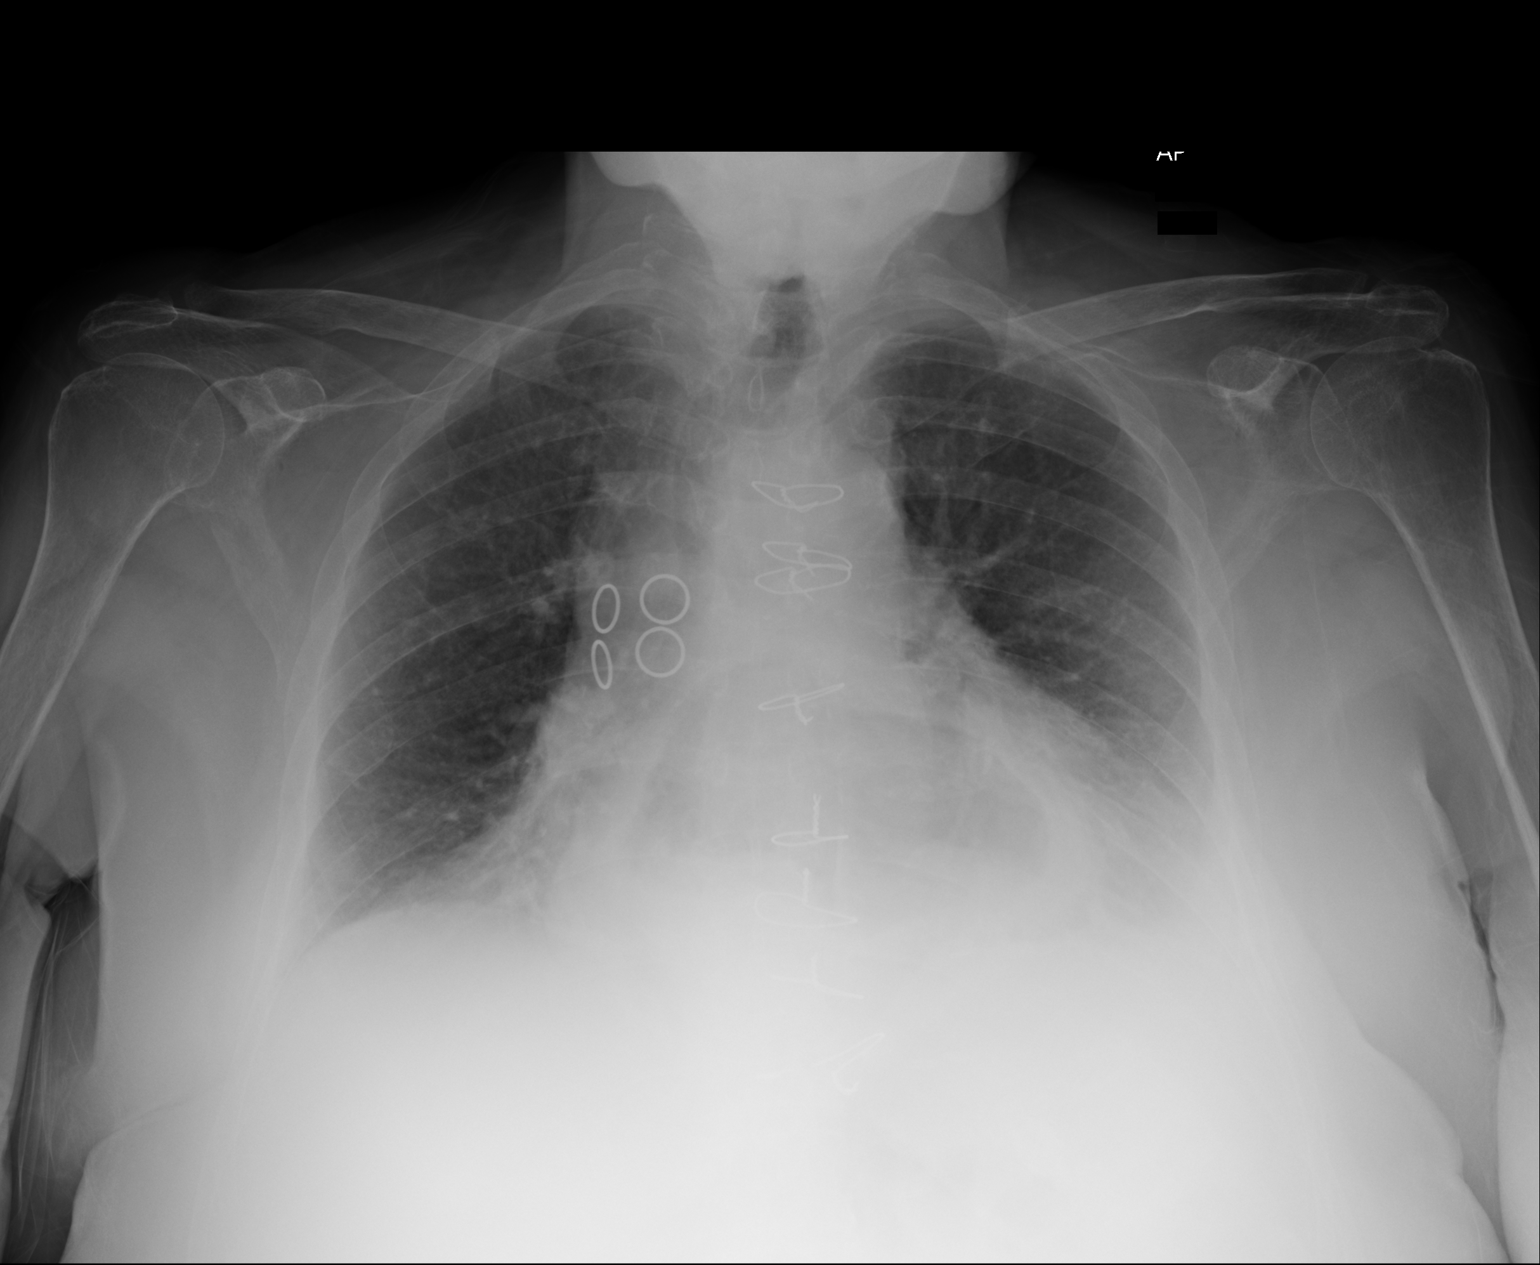

[1 of 1 positions shown; findings below may reference images not displayed]

FINDINGS: The lungs are borderline hypoinflated. There is a small left pleural
effusion. There is a large hiatal hernia. The cardiopericardial
silhouette is enlarged. The patient has undergone previous CABG. The
pulmonary vascularity is normal. The bony thorax exhibits no acute
abnormality. There is diffuse osteopenia.
IMPRESSION: There is enlargement of cardiac silhouette with small left pleural
effusion consistent with low grade CHF. There is no pulmonary
interstitial or alveolar edema nor pneumonia. There is a large
hiatal hernia.

When the patient can tolerate the procedure, a PA and lateral chest
x-ray would be useful.

## 2015-09-30 DIAGNOSIS — K219 Gastro-esophageal reflux disease without esophagitis: Secondary | ICD-10-CM

## 2015-09-30 DIAGNOSIS — K2900 Acute gastritis without bleeding: Secondary | ICD-10-CM | POA: Diagnosis not present

## 2015-09-30 DIAGNOSIS — G309 Alzheimer's disease, unspecified: Secondary | ICD-10-CM | POA: Diagnosis not present

## 2015-09-30 DIAGNOSIS — I2510 Atherosclerotic heart disease of native coronary artery without angina pectoris: Secondary | ICD-10-CM | POA: Diagnosis not present

## 2015-09-30 DIAGNOSIS — M1990 Unspecified osteoarthritis, unspecified site: Secondary | ICD-10-CM | POA: Diagnosis not present

## 2015-09-30 DIAGNOSIS — F413 Other mixed anxiety disorders: Secondary | ICD-10-CM

## 2015-11-20 DIAGNOSIS — G309 Alzheimer's disease, unspecified: Secondary | ICD-10-CM | POA: Diagnosis not present

## 2015-11-20 DIAGNOSIS — F419 Anxiety disorder, unspecified: Secondary | ICD-10-CM

## 2015-11-20 DIAGNOSIS — K219 Gastro-esophageal reflux disease without esophagitis: Secondary | ICD-10-CM

## 2015-11-20 DIAGNOSIS — K5900 Constipation, unspecified: Secondary | ICD-10-CM | POA: Diagnosis not present

## 2015-11-20 DIAGNOSIS — M1990 Unspecified osteoarthritis, unspecified site: Secondary | ICD-10-CM | POA: Diagnosis not present

## 2015-11-20 DIAGNOSIS — I2510 Atherosclerotic heart disease of native coronary artery without angina pectoris: Secondary | ICD-10-CM | POA: Diagnosis not present

## 2016-02-03 DIAGNOSIS — M1990 Unspecified osteoarthritis, unspecified site: Secondary | ICD-10-CM | POA: Diagnosis not present

## 2016-02-03 DIAGNOSIS — I2510 Atherosclerotic heart disease of native coronary artery without angina pectoris: Secondary | ICD-10-CM | POA: Diagnosis not present

## 2016-02-03 DIAGNOSIS — F419 Anxiety disorder, unspecified: Secondary | ICD-10-CM

## 2016-02-03 DIAGNOSIS — K219 Gastro-esophageal reflux disease without esophagitis: Secondary | ICD-10-CM

## 2016-02-03 DIAGNOSIS — K5900 Constipation, unspecified: Secondary | ICD-10-CM | POA: Diagnosis not present

## 2016-02-03 DIAGNOSIS — G309 Alzheimer's disease, unspecified: Secondary | ICD-10-CM | POA: Diagnosis not present

## 2016-02-22 DIAGNOSIS — D649 Anemia, unspecified: Secondary | ICD-10-CM | POA: Diagnosis not present

## 2016-02-22 DIAGNOSIS — E785 Hyperlipidemia, unspecified: Secondary | ICD-10-CM | POA: Diagnosis not present

## 2016-04-01 DIAGNOSIS — K219 Gastro-esophageal reflux disease without esophagitis: Secondary | ICD-10-CM

## 2016-04-01 DIAGNOSIS — I2510 Atherosclerotic heart disease of native coronary artery without angina pectoris: Secondary | ICD-10-CM | POA: Diagnosis not present

## 2016-04-01 DIAGNOSIS — K5900 Constipation, unspecified: Secondary | ICD-10-CM | POA: Diagnosis not present

## 2016-04-01 DIAGNOSIS — M1990 Unspecified osteoarthritis, unspecified site: Secondary | ICD-10-CM | POA: Diagnosis not present

## 2016-04-01 DIAGNOSIS — F419 Anxiety disorder, unspecified: Secondary | ICD-10-CM

## 2016-04-01 DIAGNOSIS — G309 Alzheimer's disease, unspecified: Secondary | ICD-10-CM | POA: Diagnosis not present

## 2016-06-08 DIAGNOSIS — G309 Alzheimer's disease, unspecified: Secondary | ICD-10-CM | POA: Diagnosis not present

## 2016-06-08 DIAGNOSIS — M1990 Unspecified osteoarthritis, unspecified site: Secondary | ICD-10-CM | POA: Diagnosis not present

## 2016-06-08 DIAGNOSIS — I2510 Atherosclerotic heart disease of native coronary artery without angina pectoris: Secondary | ICD-10-CM | POA: Diagnosis not present

## 2016-06-08 DIAGNOSIS — K219 Gastro-esophageal reflux disease without esophagitis: Secondary | ICD-10-CM | POA: Diagnosis not present

## 2016-06-08 DIAGNOSIS — K5900 Constipation, unspecified: Secondary | ICD-10-CM | POA: Diagnosis not present

## 2016-06-08 DIAGNOSIS — F419 Anxiety disorder, unspecified: Secondary | ICD-10-CM | POA: Diagnosis not present

## 2016-08-10 DIAGNOSIS — F419 Anxiety disorder, unspecified: Secondary | ICD-10-CM | POA: Diagnosis not present

## 2016-08-10 DIAGNOSIS — G309 Alzheimer's disease, unspecified: Secondary | ICD-10-CM | POA: Diagnosis not present

## 2016-08-10 DIAGNOSIS — K219 Gastro-esophageal reflux disease without esophagitis: Secondary | ICD-10-CM | POA: Diagnosis not present

## 2016-08-10 DIAGNOSIS — K5900 Constipation, unspecified: Secondary | ICD-10-CM | POA: Diagnosis not present

## 2016-08-10 DIAGNOSIS — I2510 Atherosclerotic heart disease of native coronary artery without angina pectoris: Secondary | ICD-10-CM | POA: Diagnosis not present

## 2016-08-10 DIAGNOSIS — M1990 Unspecified osteoarthritis, unspecified site: Secondary | ICD-10-CM | POA: Diagnosis not present

## 2016-10-12 DIAGNOSIS — K219 Gastro-esophageal reflux disease without esophagitis: Secondary | ICD-10-CM | POA: Diagnosis not present

## 2016-10-12 DIAGNOSIS — I2510 Atherosclerotic heart disease of native coronary artery without angina pectoris: Secondary | ICD-10-CM | POA: Diagnosis not present

## 2016-10-12 DIAGNOSIS — F419 Anxiety disorder, unspecified: Secondary | ICD-10-CM | POA: Diagnosis not present

## 2016-10-12 DIAGNOSIS — K5900 Constipation, unspecified: Secondary | ICD-10-CM | POA: Diagnosis not present

## 2016-10-12 DIAGNOSIS — M1990 Unspecified osteoarthritis, unspecified site: Secondary | ICD-10-CM | POA: Diagnosis not present

## 2016-10-12 DIAGNOSIS — G300 Alzheimer's disease with early onset: Secondary | ICD-10-CM | POA: Diagnosis not present

## 2016-11-16 DIAGNOSIS — M1990 Unspecified osteoarthritis, unspecified site: Secondary | ICD-10-CM | POA: Diagnosis not present

## 2016-11-16 DIAGNOSIS — F419 Anxiety disorder, unspecified: Secondary | ICD-10-CM | POA: Diagnosis not present

## 2016-11-16 DIAGNOSIS — R05 Cough: Secondary | ICD-10-CM | POA: Diagnosis not present

## 2016-11-16 DIAGNOSIS — K219 Gastro-esophageal reflux disease without esophagitis: Secondary | ICD-10-CM | POA: Diagnosis not present

## 2016-11-16 DIAGNOSIS — G309 Alzheimer's disease, unspecified: Secondary | ICD-10-CM | POA: Diagnosis not present

## 2016-11-16 DIAGNOSIS — I2510 Atherosclerotic heart disease of native coronary artery without angina pectoris: Secondary | ICD-10-CM | POA: Diagnosis not present

## 2016-11-18 DIAGNOSIS — T783XXA Angioneurotic edema, initial encounter: Secondary | ICD-10-CM | POA: Diagnosis not present

## 2017-01-20 DIAGNOSIS — F419 Anxiety disorder, unspecified: Secondary | ICD-10-CM | POA: Diagnosis not present

## 2017-01-20 DIAGNOSIS — M1990 Unspecified osteoarthritis, unspecified site: Secondary | ICD-10-CM | POA: Diagnosis not present

## 2017-01-20 DIAGNOSIS — K219 Gastro-esophageal reflux disease without esophagitis: Secondary | ICD-10-CM | POA: Diagnosis not present

## 2017-01-20 DIAGNOSIS — I2510 Atherosclerotic heart disease of native coronary artery without angina pectoris: Secondary | ICD-10-CM | POA: Diagnosis not present

## 2017-01-20 DIAGNOSIS — G309 Alzheimer's disease, unspecified: Secondary | ICD-10-CM | POA: Diagnosis not present

## 2017-01-28 DIAGNOSIS — R319 Hematuria, unspecified: Secondary | ICD-10-CM | POA: Diagnosis not present

## 2017-02-20 DIAGNOSIS — E785 Hyperlipidemia, unspecified: Secondary | ICD-10-CM | POA: Diagnosis not present

## 2017-02-20 DIAGNOSIS — D649 Anemia, unspecified: Secondary | ICD-10-CM | POA: Diagnosis not present

## 2017-03-24 DIAGNOSIS — M1990 Unspecified osteoarthritis, unspecified site: Secondary | ICD-10-CM | POA: Diagnosis not present

## 2017-03-24 DIAGNOSIS — I2510 Atherosclerotic heart disease of native coronary artery without angina pectoris: Secondary | ICD-10-CM | POA: Diagnosis not present

## 2017-03-24 DIAGNOSIS — F419 Anxiety disorder, unspecified: Secondary | ICD-10-CM | POA: Diagnosis not present

## 2017-03-24 DIAGNOSIS — K219 Gastro-esophageal reflux disease without esophagitis: Secondary | ICD-10-CM | POA: Diagnosis not present

## 2017-03-24 DIAGNOSIS — G309 Alzheimer's disease, unspecified: Secondary | ICD-10-CM | POA: Diagnosis not present

## 2017-03-29 DIAGNOSIS — K279 Peptic ulcer, site unspecified, unspecified as acute or chronic, without hemorrhage or perforation: Secondary | ICD-10-CM | POA: Diagnosis not present

## 2017-03-29 DIAGNOSIS — M159 Polyosteoarthritis, unspecified: Secondary | ICD-10-CM | POA: Diagnosis not present

## 2017-03-29 DIAGNOSIS — K922 Gastrointestinal hemorrhage, unspecified: Secondary | ICD-10-CM | POA: Diagnosis not present

## 2017-03-30 ENCOUNTER — Encounter: Payer: Self-pay | Admitting: Emergency Medicine

## 2017-03-30 ENCOUNTER — Inpatient Hospital Stay
Admission: EM | Admit: 2017-03-30 | Discharge: 2017-04-01 | DRG: 378 | Disposition: A | Discharge status: Home / Self Care | Payer: Medicare Other | Attending: Internal Medicine | Admitting: Internal Medicine

## 2017-03-30 ENCOUNTER — Other Ambulatory Visit: Payer: Self-pay

## 2017-03-30 DIAGNOSIS — K253 Acute gastric ulcer without hemorrhage or perforation: Secondary | ICD-10-CM

## 2017-03-30 DIAGNOSIS — Z881 Allergy status to other antibiotic agents status: Secondary | ICD-10-CM

## 2017-03-30 DIAGNOSIS — Z66 Do not resuscitate: Secondary | ICD-10-CM | POA: Diagnosis present

## 2017-03-30 DIAGNOSIS — R9431 Abnormal electrocardiogram [ECG] [EKG]: Secondary | ICD-10-CM | POA: Diagnosis not present

## 2017-03-30 DIAGNOSIS — K274 Chronic or unspecified peptic ulcer, site unspecified, with hemorrhage: Secondary | ICD-10-CM | POA: Diagnosis not present

## 2017-03-30 DIAGNOSIS — I252 Old myocardial infarction: Secondary | ICD-10-CM

## 2017-03-30 DIAGNOSIS — D649 Anemia, unspecified: Secondary | ICD-10-CM | POA: Diagnosis not present

## 2017-03-30 DIAGNOSIS — K219 Gastro-esophageal reflux disease without esophagitis: Secondary | ICD-10-CM | POA: Diagnosis present

## 2017-03-30 DIAGNOSIS — T39395A Adverse effect of other nonsteroidal anti-inflammatory drugs [NSAID], initial encounter: Secondary | ICD-10-CM | POA: Diagnosis present

## 2017-03-30 DIAGNOSIS — K3189 Other diseases of stomach and duodenum: Secondary | ICD-10-CM | POA: Diagnosis not present

## 2017-03-30 DIAGNOSIS — K259 Gastric ulcer, unspecified as acute or chronic, without hemorrhage or perforation: Secondary | ICD-10-CM | POA: Diagnosis not present

## 2017-03-30 DIAGNOSIS — Z791 Long term (current) use of non-steroidal anti-inflammatories (NSAID): Secondary | ICD-10-CM | POA: Diagnosis not present

## 2017-03-30 DIAGNOSIS — R4182 Altered mental status, unspecified: Secondary | ICD-10-CM | POA: Diagnosis not present

## 2017-03-30 DIAGNOSIS — E785 Hyperlipidemia, unspecified: Secondary | ICD-10-CM | POA: Diagnosis not present

## 2017-03-30 DIAGNOSIS — F0390 Unspecified dementia without behavioral disturbance: Secondary | ICD-10-CM | POA: Diagnosis present

## 2017-03-30 DIAGNOSIS — R7989 Other specified abnormal findings of blood chemistry: Secondary | ICD-10-CM | POA: Diagnosis not present

## 2017-03-30 DIAGNOSIS — Z743 Need for continuous supervision: Secondary | ICD-10-CM | POA: Diagnosis not present

## 2017-03-30 DIAGNOSIS — I2510 Atherosclerotic heart disease of native coronary artery without angina pectoris: Secondary | ICD-10-CM | POA: Diagnosis not present

## 2017-03-30 DIAGNOSIS — D62 Acute posthemorrhagic anemia: Secondary | ICD-10-CM | POA: Diagnosis not present

## 2017-03-30 DIAGNOSIS — K449 Diaphragmatic hernia without obstruction or gangrene: Secondary | ICD-10-CM | POA: Diagnosis present

## 2017-03-30 DIAGNOSIS — K922 Gastrointestinal hemorrhage, unspecified: Secondary | ICD-10-CM | POA: Diagnosis present

## 2017-03-30 DIAGNOSIS — K254 Chronic or unspecified gastric ulcer with hemorrhage: Secondary | ICD-10-CM | POA: Diagnosis not present

## 2017-03-30 DIAGNOSIS — Z79899 Other long term (current) drug therapy: Secondary | ICD-10-CM | POA: Diagnosis not present

## 2017-03-30 DIAGNOSIS — E860 Dehydration: Secondary | ICD-10-CM | POA: Diagnosis present

## 2017-03-30 DIAGNOSIS — R4701 Aphasia: Secondary | ICD-10-CM | POA: Diagnosis present

## 2017-03-30 HISTORY — DX: Gastro-esophageal reflux disease without esophagitis: K21.9

## 2017-03-30 HISTORY — DX: Hyperlipidemia, unspecified: E78.5

## 2017-03-30 HISTORY — DX: Anemia, unspecified: D64.9

## 2017-03-30 HISTORY — DX: Unspecified dementia, unspecified severity, without behavioral disturbance, psychotic disturbance, mood disturbance, and anxiety: F03.90

## 2017-03-30 LAB — COMPREHENSIVE METABOLIC PANEL
ALBUMIN: 3.2 g/dL — AB (ref 3.5–5.0)
ALK PHOS: 163 U/L — AB (ref 38–126)
ALT: 12 U/L — ABNORMAL LOW (ref 14–54)
ANION GAP: 9 (ref 5–15)
AST: 23 U/L (ref 15–41)
BILIRUBIN TOTAL: 0.6 mg/dL (ref 0.3–1.2)
BUN: 24 mg/dL — AB (ref 6–20)
CALCIUM: 8.4 mg/dL — AB (ref 8.9–10.3)
CO2: 20 mmol/L — ABNORMAL LOW (ref 22–32)
Chloride: 111 mmol/L (ref 101–111)
Creatinine, Ser: 1.05 mg/dL — ABNORMAL HIGH (ref 0.44–1.00)
GFR calc Af Amer: 54 mL/min — ABNORMAL LOW (ref >60)
GFR, EST NON AFRICAN AMERICAN: 46 mL/min — AB (ref >60)
GLUCOSE: 118 mg/dL — AB (ref 65–99)
POTASSIUM: 3.9 mmol/L (ref 3.5–5.1)
Sodium: 140 mmol/L (ref 135–145)
TOTAL PROTEIN: 6.8 g/dL (ref 6.5–8.1)

## 2017-03-30 LAB — CBC
HEMATOCRIT: 19.2 % — AB (ref 35.0–47.0)
Hemoglobin: 5.9 g/dL — ABNORMAL LOW (ref 12.0–16.0)
MCH: 27.1 pg (ref 26.0–34.0)
MCHC: 30.9 g/dL — AB (ref 32.0–36.0)
MCV: 87.6 fL (ref 80.0–100.0)
Platelets: 425 10*3/uL (ref 150–440)
RBC: 2.2 MIL/uL — ABNORMAL LOW (ref 3.80–5.20)
RDW: 18.9 % — AB (ref 11.5–14.5)
WBC: 6.8 10*3/uL (ref 3.6–11.0)

## 2017-03-30 LAB — PROTIME-INR
INR: 1.04
PROTHROMBIN TIME: 13.5 s (ref 11.4–15.2)

## 2017-03-30 LAB — SURGICAL PCR SCREEN
MRSA, PCR: NEGATIVE
Staphylococcus aureus: POSITIVE — AB

## 2017-03-30 LAB — TROPONIN I

## 2017-03-30 LAB — ABO/RH: ABO/RH(D): O POS

## 2017-03-30 LAB — APTT: aPTT: 27 seconds (ref 24–36)

## 2017-03-30 MED ORDER — PANTOPRAZOLE SODIUM 40 MG IV SOLR
8.0000 mg/h | INTRAVENOUS | Status: DC
  Administered 2017-03-30 – 2017-04-01 (×5): 8 mg/h via INTRAVENOUS
  Filled 2017-03-30 (×5): qty 80

## 2017-03-30 MED ORDER — HYDROCODONE-ACETAMINOPHEN 5-325 MG PO TABS
1.0000 | ORAL_TABLET | ORAL | Status: DC
  Administered 2017-03-31 – 2017-04-01 (×2): 1 via ORAL
  Filled 2017-03-30 (×2): qty 1

## 2017-03-30 MED ORDER — SODIUM CHLORIDE 0.9 % IV BOLUS (SEPSIS)
500.0000 mL | Freq: Once | INTRAVENOUS | Status: AC
  Administered 2017-03-30: 500 mL via INTRAVENOUS

## 2017-03-30 MED ORDER — POTASSIUM CHLORIDE IN NACL 20-0.9 MEQ/L-% IV SOLN
INTRAVENOUS | Status: DC
  Administered 2017-03-31 (×2): via INTRAVENOUS
  Filled 2017-03-30 (×4): qty 1000

## 2017-03-30 MED ORDER — ONDANSETRON HCL 4 MG/2ML IJ SOLN: 4.0000 mg | Freq: Four times a day (QID) | INTRAMUSCULAR | Status: DC | PRN

## 2017-03-30 MED ORDER — SODIUM CHLORIDE 0.9 % IV SOLN
10.0000 mL/h | Freq: Once | INTRAVENOUS | Status: AC
  Administered 2017-03-30: 10 mL/h via INTRAVENOUS

## 2017-03-30 MED ORDER — PANTOPRAZOLE SODIUM 40 MG IV SOLR: 40.0000 mg | Freq: Two times a day (BID) | INTRAVENOUS | Status: DC

## 2017-03-30 MED ORDER — PAROXETINE HCL 10 MG PO TABS
10.0000 mg | ORAL_TABLET | Freq: Every day | ORAL | Status: DC
  Administered 2017-03-31 – 2017-04-01 (×2): 10 mg via ORAL
  Filled 2017-03-30 (×2): qty 1

## 2017-03-30 MED ORDER — ACETAMINOPHEN 650 MG RE SUPP: 650.0000 mg | Freq: Four times a day (QID) | RECTAL | Status: DC | PRN

## 2017-03-30 MED ORDER — SODIUM CHLORIDE 0.9 % IV SOLN
80.0000 mg | Freq: Once | INTRAVENOUS | Status: AC
  Administered 2017-03-30: 80 mg via INTRAVENOUS
  Filled 2017-03-30: qty 80

## 2017-03-30 MED ORDER — ACETAMINOPHEN 325 MG PO TABS: 650.0000 mg | ORAL_TABLET | Freq: Four times a day (QID) | ORAL | Status: DC | PRN

## 2017-03-30 MED ORDER — ONDANSETRON HCL 4 MG PO TABS: 4.0000 mg | ORAL_TABLET | Freq: Four times a day (QID) | ORAL | Status: DC | PRN

## 2017-03-30 MED ORDER — ALBUTEROL SULFATE (2.5 MG/3ML) 0.083% IN NEBU: 2.5000 mg | INHALATION_SOLUTION | RESPIRATORY_TRACT | Status: DC | PRN

## 2017-03-30 NOTE — ED Provider Notes (Signed)
[facility] Emergency Department Provider Note  ____________________________________________  Time seen: Approximately 1:01 PM  I have reviewed the triage vital signs and the nursing notes.   HISTORY  Chief Complaint Abnormal Lab  Patient's chemistry is limited due to her expressive aphasia and dementia.  HPI Morgan Escobar is a 83 y.o. female , not anticoagluated, w/ dementia and hx of GI bleed presenting w/ anemia, pallor. The patient is unable to give any history, but the report is that she has had some significant pallor and had blood work done as an outpatient which showed a drop in hematocrit from 32-15 over the course of less than 10 days.  The nursing home paperwork is suggestive of constipation rather than any obvious bright red blood per rectum.  The patient denies any pain, but I am unable to get any additional information about her symptoms.   Past Medical History:  Diagnosis Date  . Anemia   . Dementia   . GERD (gastroesophageal reflux disease)   . Hyperlipidemia     There are no active problems to display for this patient.   History reviewed. No pertinent surgical history.    Allergies Levaquin [levofloxacin in d5w]  No family history on file.  Social History Social History   Tobacco Use  . Smoking status: Not on file  Substance Use Topics  . Alcohol use: Not on file  . Drug use: Not on file    Review of Systems Limited due to patient dementia. ____________________________________________   PHYSICAL EXAM:  VITAL SIGNS: ED Triage Vitals  Enc Vitals Group     BP 03/30/17 1230 (!) 117/47     Pulse Rate 03/30/17 1230 95     Resp 03/30/17 1230 (!) 30     Temp 03/30/17 1230 98.4 F (36.9 C)     Temp Source 03/30/17 1230 Axillary     SpO2 03/30/17 1230 97 %     Weight 03/30/17 1213 130 lb (59 kg)     Height 03/30/17 1213 5\' 2"  (1.575 m)     Head Circumference --      Peak Flow --      Pain Score --      Pain Loc --       Pain Edu? --      Excl. in [place]? --     Constitutional: The patient is alert, makes good eye contact, but answers questions and appropriately or has an expressive aphasia, mildly slurred speech. Eyes: Conjunctivae are pale.  EOMI. No scleral icterus. Head: Atraumatic. Nose: No congestion/rhinnorhea. Mouth/Throat: Mucous membranes are moist.  Neck: No stridor.  Supple.  JVD.  No meningismus. Cardiovascular: Normal rate, regular rhythm. No murmurs, rubs or gallops.  Respiratory: Normal respiratory effort.  No accessory muscle use or retractions. Lungs CTAB.  No wheezes, rales or ronchi. Gastrointestinal: Obese.  Soft, nontender and nondistended.  No guarding or rebound.  No peritoneal signs. Gu: No evidence of external palpable internal hemorrhoids.  Rectal exam without pain.  Dark stool that is guaiac positive. Musculoskeletal: No LE edema.  Neurologic: patient is alert, makes good eye contact, but has an expressive aphasia.  Face and smile are symmetric.  EOMI.  Moves all extremities well. Skin:  Skin is warm, dry and intact. No rash noted. Psychiatric: Mood and affect are normal. ____________________________________________   LABS (all labs ordered are listed, but only abnormal results are displayed)  Labs Reviewed  CBC - Abnormal; Notable for the following components:  Result Value   RBC 2.20 (*)    Hemoglobin 5.9 (*)    HCT 19.2 (*)    MCHC 30.9 (*)    RDW 18.9 (*)    All other components within normal limits  COMPREHENSIVE METABOLIC PANEL  PROTIME-INR  APTT  POC OCCULT BLOOD, ED  TYPE AND SCREEN  PREPARE RBC (CROSSMATCH)   ____________________________________________  EKG  ED ECG REPORT I, Eula Listen, the attending physician, personally viewed and interpreted this ECG.   Date: 03/30/2017  EKG Time: 1513  Rate: 90  Rhythm: normal sinus rhythm  Axis: normal  Intervals:none  ST&T Change: No  STEMI  ____________________________________________  RADIOLOGY  No results found.  ____________________________________________   PROCEDURES  Procedure(s) performed: None  Procedures  Critical Care performed: No ____________________________________________   INITIAL IMPRESSION / ASSESSMENT AND PLAN / ED COURSE  Pertinent labs & imaging results that were available during my care of the patient were reviewed by me and considered in my medical decision making (see chart for details).  83 y.o. female sent to the emergency department for anemia.  On my examination, the patient is hemodynamically stable but does have dark stool that is guaiac positive.  I am concerned about a GI bleed, including the possibility of an upper GI bleed.  The patient's CBC does show significant anemia with a hemoglobin of 5 and a hematocrit of 15; the patient has received intravenous fluids and 2 units of blood have been ordered for transfusion.  In addition, have ordered Protonix.  The patient will be admitted to the hospitalist for further evaluation and treatment.  ____________________________________________  FINAL CLINICAL IMPRESSION(S) / ED DIAGNOSES  Final diagnoses:  Gastrointestinal hemorrhage, unspecified gastrointestinal hemorrhage type  Anemia, unspecified type         NEW MEDICATIONS STARTED DURING THIS VISIT:  This SmartLink is deprecated. Use AVSMEDLIST instead to display the medication list for a patient.    Eula Listen, MD 03/30/17 1515

## 2017-03-30 NOTE — Progress Notes (Signed)
Per Dr. Bridgett Larsson okay to give units of blood over 4 hours each instead of 1.

## 2017-03-30 NOTE — H&P (Signed)
[facility] NAME: Morgan Escobar    MR#:  [id number]  DATE OF BIRTH:  1928/02/16  DATE OF ADMISSION:  03/30/2017  PRIMARY CARE PHYSICIAN: Venia Carbon, MD   REQUESTING/REFERRING PHYSICIAN: Eula Listen, MD  CHIEF COMPLAINT:   Chief Complaint  Patient presents with  . Abnormal Lab   Low hemoglobin 5.9. HISTORY OF PRESENT ILLNESS:  Morgan Escobar  is a 83 y.o. female with a known history of anemia, dementia, GERD and hyperlipidemia.  Patient was sent from memory care facility due to above chief complaint.  The patient is very demented.  According to ED physician, the patient was sent to the ED due to low hemoglobin and black stool.  Her hemoglobin was 12.9 2 months ago.  PAST MEDICAL HISTORY:   Past Medical History:  Diagnosis Date  . Anemia   . Dementia   . GERD (gastroesophageal reflux disease)   . Hyperlipidemia     PAST SURGICAL HISTORY:  History reviewed. No pertinent surgical history.  Unable to obtain due to dementia.  SOCIAL HISTORY:   Social History   Tobacco Use  . Smoking status: Not on file  Substance Use Topics  . Alcohol use: Not on file    FAMILY HISTORY:  No family history on file.  Unable to obtain due to dementia.  DRUG ALLERGIES:   Allergies  Allergen Reactions  . Levaquin [Levofloxacin In D5w]     REVIEW OF SYSTEMS:   Review of Systems  Unable to perform ROS: Dementia    MEDICATIONS AT HOME:   Prior to Admission medications   Medication Sig Start Date End Date Taking? Authorizing Provider  acetaminophen (TYLENOL) 325 MG tablet Take 325 mg by mouth every 6 (six) hours as needed.   Yes [provider]  famotidine (PEPCID) 20 MG tablet Take 20 mg by mouth daily.   Yes [provider]  HYDROcodone-acetaminophen (NORCO/VICODIN) 5-325 MG tablet Take 1 tablet by mouth every morning.   Yes [provider]  Lidocaine 4 % PTCH Apply 0.5 patches  topically every 12 (twelve) hours. Apply to knees   Yes [provider]  naproxen (NAPROSYN) 500 MG tablet Take 500 mg by mouth 2 (two) times daily with a meal.   Yes [provider]  PARoxetine (PAXIL) 10 MG tablet Take 10 mg by mouth daily.   Yes [provider]  polyethylene glycol (MIRALAX / GLYCOLAX) packet Take 17 g by mouth every other day.   Yes [provider]  sennosides (SENOKOT) 8.8 MG/5ML syrup Take 5 mLs by mouth every other day.   Yes [provider]  Vitamin D, Ergocalciferol, (DRISDOL) 50000 units CAPS capsule Take 50,000 Units by mouth every 30 (thirty) days.   Yes [provider]      VITAL SIGNS:  Blood pressure 127/66, pulse 90, temperature 98.4 F (36.9 C), temperature source Axillary, resp. rate 17, height 5\' 2"  (1.575 m), weight 130 lb (59 kg), SpO2 98 %.  PHYSICAL EXAMINATION:  Physical Exam  GENERAL:  83 y.o.-year-old patient lying in the bed with no acute distress.  EYES: Pupils equal, round, reactive to light and accommodation. No scleral icterus.  Pale conjunctivae.  Extraocular muscles intact.  HEENT: Head atraumatic, normocephalic. Oropharynx and nasopharynx clear.  NECK:  Supple, no jugular venous distention. No thyroid enlargement, no tenderness.  LUNGS: Normal breath sounds bilaterally, no wheezing, rales,rhonchi or crepitation. No use of accessory muscles of respiration.  CARDIOVASCULAR:  S1, S2 normal. No murmurs, rubs, or gallops.  ABDOMEN: Soft, nontender, nondistended. Bowel sounds present. No organomegaly or mass.  EXTREMITIES: No pedal edema, cyanosis, or clubbing.  NEUROLOGIC: Unable to exam. PSYCHIATRIC: The patient is very demented. SKIN: No obvious rash, lesion, or ulcer.  Looks pale.  LABORATORY PANEL:   CBC Recent Labs  Lab 03/30/17 1236  WBC 6.8  HGB 5.9*  HCT 19.2*  PLT 425    ------------------------------------------------------------------------------------------------------------------  Chemistries  Recent Labs  Lab 03/30/17 1236  NA 140  K 3.9  CL 111  CO2 20*  GLUCOSE 118*  BUN 24*  CREATININE 1.05*  CALCIUM 8.4*  AST 23  ALT 12*  ALKPHOS 163*  BILITOT 0.6   ------------------------------------------------------------------------------------------------------------------  Cardiac Enzymes Recent Labs  Lab 03/30/17 1340  TROPONINI <0.03   ------------------------------------------------------------------------------------------------------------------  RADIOLOGY:  No results found.    IMPRESSION AND PLAN:   Acute GI bleeding The patient will be admitted to medical floor.  N.p.o. except medication. Continue Protonix IV infusion, follow-up hemoglobin after blood transfusion and a GI consult.  Severe anemia due to above. PRBC transfusion 2 units for now, follow-up hemoglobin after blood transfusion, PRBC transfusion as needed.  Dehydration.  IV fluids support and follow-up BMP.  Dementia  Aspiration the fall precaution.  All the records are reviewed and case discussed with ED provider. Management plans discussed with the patient, family and they are in agreement.  CODE STATUS: DNR.  TOTAL TIME TAKING CARE OF THIS PATIENT: 46 minutes.    Demetrios Loll M.D on 03/30/2017 at 2:53 PM  Between 7am to 6pm - Pager - [phone]  After 6pm go to [email or web address] - Proofreader  [facility]  Office  [phone]  CC: Primary care physician; Venia Carbon, MD   Note: This dictation was prepared with Dragon dictation along with smaller phrase technology. Any transcriptional errors that result from this process are unin

## 2017-03-30 NOTE — ED Triage Notes (Signed)
Pt in via [facility] from [facility]; sent out for abnormal lab, per facility drop in Hgb from 11-5 in one month.  Pt denies any complaints, dementia at baseline, vitals WLD, NAD noted at this time.

## 2017-03-31 ENCOUNTER — Inpatient Hospital Stay: Payer: Medicare Other | Admitting: Certified Registered Nurse Anesthetist

## 2017-03-31 ENCOUNTER — Encounter
Admission: EM | Disposition: A | Discharge status: Home / Self Care | Payer: Self-pay | Source: Home / Self Care | Attending: Internal Medicine

## 2017-03-31 ENCOUNTER — Encounter: Payer: Self-pay | Admitting: *Deleted

## 2017-03-31 DIAGNOSIS — K3189 Other diseases of stomach and duodenum: Secondary | ICD-10-CM

## 2017-03-31 DIAGNOSIS — D62 Acute posthemorrhagic anemia: Secondary | ICD-10-CM

## 2017-03-31 DIAGNOSIS — K253 Acute gastric ulcer without hemorrhage or perforation: Secondary | ICD-10-CM

## 2017-03-31 HISTORY — PX: ESOPHAGOGASTRODUODENOSCOPY: SHX5428

## 2017-03-31 LAB — BASIC METABOLIC PANEL
ANION GAP: 6 (ref 5–15)
BUN: 18 mg/dL (ref 6–20)
CHLORIDE: 115 mmol/L — AB (ref 101–111)
CO2: 19 mmol/L — ABNORMAL LOW (ref 22–32)
Calcium: 7.9 mg/dL — ABNORMAL LOW (ref 8.9–10.3)
Creatinine, Ser: 0.88 mg/dL (ref 0.44–1.00)
GFR calc Af Amer: >60 mL/min (ref >60)
GFR calc non Af Amer: 57 mL/min — ABNORMAL LOW (ref >60)
GLUCOSE: 98 mg/dL (ref 65–99)
POTASSIUM: 3.7 mmol/L (ref 3.5–5.1)
Sodium: 140 mmol/L (ref 135–145)

## 2017-03-31 LAB — CBC
HEMATOCRIT: 26.5 % — AB (ref 35.0–47.0)
HEMOGLOBIN: 8.8 g/dL — AB (ref 12.0–16.0)
MCH: 28.4 pg (ref 26.0–34.0)
MCHC: 33.3 g/dL (ref 32.0–36.0)
MCV: 85.2 fL (ref 80.0–100.0)
Platelets: 321 10*3/uL (ref 150–440)
RBC: 3.11 MIL/uL — ABNORMAL LOW (ref 3.80–5.20)
RDW: 16.2 % — AB (ref 11.5–14.5)
WBC: 6.3 10*3/uL (ref 3.6–11.0)

## 2017-03-31 SURGERY — EGD (ESOPHAGOGASTRODUODENOSCOPY)
Anesthesia: General

## 2017-03-31 MED ORDER — MUPIROCIN 2 % EX OINT
1.0000 "application " | TOPICAL_OINTMENT | Freq: Two times a day (BID) | CUTANEOUS | Status: DC
  Administered 2017-03-31 – 2017-04-01 (×3): 1 via NASAL
  Filled 2017-03-31: qty 22

## 2017-03-31 MED ORDER — PROPOFOL 10 MG/ML IV BOLUS
INTRAVENOUS | Status: DC | PRN
  Administered 2017-03-31 (×2): 10 mg via INTRAVENOUS
  Administered 2017-03-31: 50 mg via INTRAVENOUS

## 2017-03-31 MED ORDER — SODIUM CHLORIDE 0.9 % IV SOLN
INTRAVENOUS | Status: DC | PRN
  Administered 2017-03-31: 14:00:00 via INTRAVENOUS

## 2017-03-31 MED ORDER — HYDRALAZINE HCL 20 MG/ML IJ SOLN
10.0000 mg | INTRAMUSCULAR | Status: DC | PRN
  Administered 2017-03-31 (×2): 10 mg via INTRAVENOUS
  Filled 2017-03-31 (×2): qty 1

## 2017-03-31 MED ORDER — CHLORHEXIDINE GLUCONATE CLOTH 2 % EX PADS: 6.0000 | MEDICATED_PAD | Freq: Every day | CUTANEOUS | Status: DC

## 2017-03-31 MED ORDER — PROPOFOL 10 MG/ML IV BOLUS
INTRAVENOUS | Status: AC
  Filled 2017-03-31: qty 20

## 2017-03-31 MED ORDER — LIDOCAINE HCL (CARDIAC) 20 MG/ML IV SOLN
INTRAVENOUS | Status: DC | PRN
  Administered 2017-03-31: 60 mg via INTRAVENOUS

## 2017-03-31 MED ORDER — PROPOFOL 500 MG/50ML IV EMUL
INTRAVENOUS | Status: DC | PRN
  Administered 2017-03-31: 140 ug/kg/min via INTRAVENOUS

## 2017-03-31 NOTE — Anesthesia Preprocedure Evaluation (Signed)
Anesthesia Evaluation  Patient identified by MRN, date of birth, ID band Patient awake    Reviewed: Allergy & Precautions, NPO status , Patient's Chart, lab work & pertinent test results  History of Anesthesia Complications Negative for: history of anesthetic complications  Airway Mallampati: II  TM Distance: >3 FB Neck ROM: Full    Dental  (+) Poor Dentition   Pulmonary neg pulmonary ROS, neg sleep apnea, neg COPD,    breath sounds clear to auscultation- rhonchi (-) wheezing      Cardiovascular (-) hypertension+ CAD, + Past MI and + CABG   Rhythm:Regular Rate:Normal - Systolic murmurs and - Diastolic murmurs    Neuro/Psych Dementia  negative psych ROS   GI/Hepatic Neg liver ROS, GERD  ,  Endo/Other  negative endocrine ROSneg diabetes  Renal/GU negative Renal ROS     Musculoskeletal negative musculoskeletal ROS (+)   Abdominal (+) - obese,   Peds  Hematology  (+) anemia ,   Anesthesia Other Findings Past Medical History: No date: Anemia No date: Dementia No date: GERD (gastroesophageal reflux disease) No date: Hyperlipidemia   Reproductive/Obstetrics                             Anesthesia Physical Anesthesia Plan  ASA: III  Anesthesia Plan: General   Post-op Pain Management:    Induction: Intravenous  PONV Risk Score and Plan: 2 and Propofol infusion  Airway Management Planned: Natural Airway  Additional Equipment:   Intra-op Plan:   Post-operative Plan:   Informed Consent: I have reviewed the patients History and Physical, chart, labs and discussed the procedure including the risks, benefits and alternatives for the proposed anesthesia with the patient or authorized representative who has indicated his/her understanding and acceptance.   Dental advisory given and Consent reviewed with POA  Plan Discussed with: CRNA and Anesthesiologist  Anesthesia Plan Comments:  (Pt has DNR order, we discussed plan for suspension of DNR order during periop period due to possible need for respiratory support, BP support and potential reactions to medications during the procedure. )        Anesthesia Quick Evaluation

## 2017-03-31 NOTE — Anesthesia Post-op Follow-up Note (Signed)
Anesthesia QCDR form completed.        

## 2017-03-31 NOTE — Clinical Social Work Note (Signed)
CSW spoke with Morgan Escobar from [facility] and she stated patient can return over the weekend if needed. Patient will need the diagnosis of dementia to be placed first on the FL2. Morgan Escobar MSW,LCSW

## 2017-03-31 NOTE — Op Note (Signed)
[facility] Gastroenterology Patient Name: Morgan Escobar Procedure Date: 03/31/2017 2:16 PM MRN: [id number] Account #: [id number] Date of Birth: Feb 17, 1928 Admit Type: Inpatient Age: 83 Room: [facility] ENDO ROOM 4 Gender: Female Note Status: Finalized Procedure:            Upper GI endoscopy Indications:          Acute post hemorrhagic anemia Providers:            Aleksandr Pellow B. Bonna Gains MD, MD Referring MD:         Venia Carbon (Referring MD) Medicines:            Monitored Anesthesia Care Complications:        No immediate complications. Procedure:            Pre-Anesthesia Assessment:                       - The risks and benefits of the procedure and the                        sedation options and risks were discussed with the                        patient. All questions were answered and informed                        consent was obtained.                       - Patient identification and proposed procedure were                        verified prior to the procedure.                       - ASA Grade Assessment: III - A patient with severe                        systemic disease.                       After obtaining informed consent, the endoscope was                        passed under direct vision. Throughout the procedure,                        the patient's blood pressure, pulse, and oxygen                        saturations were monitored continuously. The Endoscope                        was introduced through the mouth, and advanced to the                        third part of duodenum. The upper GI endoscopy was                        accomplished with ease. The patient tolerated the  procedure well. Findings:      The examined esophagus was normal.      Four non-bleeding superficial gastric ulcers with no stigmata of       bleeding were found in the gastric body. The largest lesion was 5 mm in       largest dimension. No  biopsies performed due to patient's recent GI       bleed.      Patchy mildly erythematous mucosa without bleeding was found in the       stomach.      The examined duodenum was normal.      No evidence of old or recent blood seen throughout the exam.      A hiatal hernia was present. Impression:           - Normal esophagus.                       - Non-bleeding gastric ulcers with no stigmata of                        bleeding. These were the source of her anemia and were                        likely caused by her NSAID use.                       - Erythematous mucosa in the stomach.                       - Normal examined duodenum.                       - Hiatal hernia.                       - No specimens collected. Recommendation:       - Return patient to hospital ward for ongoing care.                       - Clear liquid diet today, then advance as tolerated to                        regular diet if no further signs of GI bleeding.                       - Use Protonix (pantoprazole) 40 mg IV BID while                        inpatient and continue at same dose outpatient for 4                        weeks and then once daily for another 4 weeks. If no                        further NSAID use or anticoagulant use after that, and                        no further anemia or GI Bleed medication can be  discontinued after a total of 8 weeks as stated above.                       - No NSAIDS throughout liftetime                       - If pt has further anemia or GI bleeding she would                        then require a colonoscopy.                       - The findings and recommendations were discussed with                        the patient's family. Procedure Code(s):    --- Professional ---                       [phone], Esophagogastroduodenoscopy, flexible, transoral;                        diagnostic, including collection of specimen(s) by                         brushing or washing, when performed (separate procedure) Diagnosis Code(s):    --- Professional ---                       K25.9, Gastric ulcer, unspecified as acute or chronic,                        without hemorrhage or perforation                       K31.89, Other diseases of stomach and duodenum                       D62, Acute posthemorrhagic anemia CPT copyright 2016 American Medical Association. All rights reserved. The codes documented in this report are preliminary and upon coder review may  be revised to meet current compliance requirements.  Vonda Antigua, MD Margretta Sidle B. Bonna Gains MD, MD 03/31/2017 2:42:50 PM This report has been signed electronically. Number of Addenda: 0 Note Initiated On: 03/31/2017 2:16 PM      [facility]

## 2017-03-31 NOTE — OR Nursing (Signed)
REPORT TO GINA ON 2C. PT S/P EGD. 2 SMALL GASTRIC ULCERS NON BLEEDING. PT TO RETURN TO FLOOR  VIA ORDERLY

## 2017-03-31 NOTE — Clinical Social Work Note (Signed)
Clinical Social Work Assessment  Patient Details  Name: Morgan Escobar MRN: [id number] Date of Birth: Feb 21, 1928  Date of referral:  03/31/17               Reason for consult:  Discharge Planning                Permission sought to share information with:    Permission granted to share information::     Name::        Agency::     Relationship::     Contact Information:     Housing/Transportation Living arrangements for the past 2 months:  [place] of Information:  Adult Children Patient Interpreter Needed:  None Criminal Activity/Legal Involvement Pertinent to Current Situation/Hospitalization:  No - Comment as needed Significant Relationships:  Adult Children Lives with:  Facility Resident Do you feel safe going back to the place where you live?  Yes Need for family participation in patient care:  Yes (Comment)  Care giving concerns:  Patient resides at [facility] Worker assessment / plan:  CSW spoke with patient's son: Shaleen Talamantez: [phone]. Patient's son confirmed patient was from [facility]. Patient's son stated that he wishes for her to return. She is wheelchair bound at baseline. Patient's son stated he would like to be notified of patient's discharge. He would like for [facility] to transport or EMS to transport. Patient would qualify for EMS due to the altered mental status.   Employment status:  Retired Forensic scientist:  Medicare PT Recommendations:    Information / Referral to community resources:     Patient/Family's Response to care:  Patient is pleasantly confused and did not speak but smiled. Patient's son expressed appreciation for CSW assistance.  Patient/Family's Understanding of and Emotional Response to Diagnosis, Current Treatment, and Prognosis:  Patiient's son has requested for MD to call him to keep him updated as he is involved with his mother's care.  Emotional Assessment Appearance:     Attitude/Demeanor/Rapport:  (pleasant but quiet) Affect (typically observed):    Orientation:  Oriented to Self Alcohol / Substance use:  Not Applicable Psych involvement (Current and /or in the community):  No (Comment)  Discharge Needs  Concerns to be addressed:  Care Coordination Readmission within the last 30 days:  No Current discharge risk:  None Barriers to Discharge:  No Barriers Identified   Shela Leff, LCSW 03/31/2017, 4:32 PM

## 2017-03-31 NOTE — Consult Note (Signed)
Vonda Antigua, MD [address] [place], [place], [place], [address] [address] [place], [place], [place], [address] Phone: [phone]  Fax: [phone]  Consultation  Referring Provider:     Dr. Posey Pronto  Primary Care Physician:  Venia Carbon, MD Primary Gastroenterologist:  Virgel Manifold, MD        Reason for Consultation:     Anemia  Date of Admission:  03/30/2017 Date of Consultation:  03/31/2017         HPI:   Morgan Escobar is a 83 y.o. female with dementia sent from living facility due to anemia with hemoglobin of 5.9.  Patient is on naproxen at the facility.  Patient unable to provide any history due to dementia and mental status.  Denies any pain at this time.  No emesis.  No bowel movement since presentation.  Past Medical History:  Diagnosis Date  . Anemia   . Dementia   . GERD (gastroesophageal reflux disease)   . Hyperlipidemia     History reviewed. No pertinent surgical history.  Prior to Admission medications   Medication Sig Start Date End Date Taking? Authorizing Provider  acetaminophen (TYLENOL) 325 MG tablet Take 325 mg by mouth every 6 (six) hours as needed.   Yes [provider]  famotidine (PEPCID) 20 MG tablet Take 20 mg by mouth daily.   Yes [provider]  HYDROcodone-acetaminophen (NORCO/VICODIN) 5-325 MG tablet Take 1 tablet by mouth every morning.   Yes [provider]  Lidocaine 4 % PTCH Apply 0.5 patches topically every 12 (twelve) hours. Apply to knees   Yes [provider]  naproxen (NAPROSYN) 500 MG tablet Take 500 mg by mouth 2 (two) times daily with a meal.   Yes [provider]  PARoxetine (PAXIL) 10 MG tablet Take 10 mg by mouth daily.   Yes [provider]  polyethylene glycol (MIRALAX / GLYCOLAX) packet Take 17 g by mouth every other day.   Yes [provider]  sennosides (SENOKOT) 8.8 MG/5ML syrup Take 5 mLs by mouth every other day.   Yes [provider]  Vitamin D, Ergocalciferol, (DRISDOL) 50000 units CAPS capsule Take 50,000 Units by mouth every 30 (thirty) days.   Yes [provider]    History reviewed. No pertinent family history.   Social History   Tobacco Use  . Smoking status: Never Smoker  . Smokeless tobacco: Never Used  Substance Use Topics  . Alcohol use: Not on file  . Drug use: Not on file    Allergies as of 03/30/2017 - Review Complete 03/30/2017  Allergen Reaction Noted  . Levaquin [levofloxacin in d5w]  03/30/2017    Review of Systems:    All systems reviewed and negative except where noted in HPI.   Physical Exam:  Vital signs in last 24 hours: Vitals:   03/31/17 1150 03/31/17 1346 03/31/17 1440 03/31/17 1541  BP: (!) 182/66 (!) 156/118 101/65 (!) 152/77  Pulse: 79 89 87 86  Resp:  18 14   Temp: 98.1 F (36.7 C) (!) 96.9 F (36.1 C) (!) 96.4 F (35.8 C) 98.7 F (37.1 C)  TempSrc: Oral Tympanic Tympanic Oral  SpO2: 99% 100% 92% 99%  Weight:      Height:       Last BM Date: 03/30/17 General:   Pleasant, cooperative in NAD Head:  Normocephalic and atraumatic. Eyes:   No icterus.   Conjunctiva pink. PERRLA. Ears:  Normal auditory acuity. Neck:  Supple;  no masses or thyroidomegaly Lungs: Respirations even and unlabored. Lungs clear to auscultation bilaterally.   No wheezes, crackles, or rhonchi.  Heart:  Regular rate and rhythm;  Without murmur, clicks, rubs or gallops Abdomen:  Soft, nondistended, nontender. Normal bowel sounds. No appreciable masses or hepatomegaly.  No rebound or guarding.  Skin:  Intact without significant lesions or rashes. Cervical Nodes:  No significant cervical adenopathy. Psych:  Alert and cooperative. Normal affect.  LAB RESULTS: Recent Labs    03/30/17 1236 03/31/17 0408  WBC 6.8 6.3  HGB 5.9* 8.8*  HCT 19.2* 26.5*  PLT 425 321   BMET Recent Labs    03/30/17 1236 03/31/17 0408  NA 140 140  K 3.9 3.7  CL 111 115*  CO2 20* 19*    GLUCOSE 118* 98  BUN 24* 18  CREATININE 1.05* 0.88  CALCIUM 8.4* 7.9*   LFT Recent Labs    03/30/17 1236  PROT 6.8  ALBUMIN 3.2*  AST 23  ALT 12*  ALKPHOS 163*  BILITOT 0.6   PT/INR Recent Labs    03/30/17 1340  LABPROT 13.5  INR 1.04    STUDIES: No results found.    Impression / Plan:   Morgan Escobar is a 83 y.o. y/o female with dementia presented with hemoglobin of 5.9 in setting of naproxen use at home  Possible upper GI bleed due to peptic ulcer disease in the setting of NSAID use Hemoglobin appropriately responded to PRBC transfusion Patient is hemodynamically stable Continue Protonix 40 mg IV twice daily Proceed with EGD See EGD report for details and recommendations  Thank you for involving me in the care of this patient.      LOS: 1 day   Virgel Manifold, MD  03/31/2017, 4:10 PM

## 2017-03-31 NOTE — Transfer of Care (Signed)
Immediate Anesthesia Transfer of Care Note  Patient: Morgan Escobar  Procedure(s) Performed: ESOPHAGOGASTRODUODENOSCOPY (EGD) (N/A )  Patient Location: PACU  Anesthesia Type:General  Level of Consciousness: sedated  Airway & Oxygen Therapy: Patient Spontanous Breathing and Patient connected to nasal cannula oxygen  Post-op Assessment: Report given to RN and Post -op Vital signs reviewed and stable  Post vital signs: Reviewed and stable  Last Vitals:  Vitals:   03/31/17 1346 03/31/17 1440  BP: (!) 156/118 101/65  Pulse: 89 87  Resp: 18 14  Temp: (!) 36.1 C (!) 35.8 C  SpO2: 100% 92%    Last Pain:  Vitals:   03/31/17 1440  TempSrc: Tympanic  PainSc:          Complications: No apparent anesthesia complications

## 2017-03-31 NOTE — Progress Notes (Signed)
[facility] [place] NAME: Morgan Escobar    MR#:  [id number]  DATE OF BIRTH:  [birth year]  SUBJECTIVE:   Pt has dementia--unable to provide any history or review of system.  Per RN no active bleeding. Patient is status post 4 units of blood transfusion.  Hemoglobin stable.  Vitals stable. REVIEW OF SYSTEMS:   Review of Systems  Unable to perform ROS: Dementia   Tolerating Diet: N.p.o.   DRUG ALLERGIES:   Allergies  Allergen Reactions  . Levaquin [Levofloxacin In D5w]     VITALS:  Blood pressure (!) 154/67, pulse 79, temperature 97.6 F (36.4 C), temperature source Oral, resp. rate 17, height 5\' 2"  (1.575 m), weight 59 kg (130 lb), SpO2 96 %.  PHYSICAL EXAMINATION:   Physical Exam  GENERAL:  83 y.o.-year-old patient lying in the bed with no acute distress.  EYES: Pupils equal, round, reactive to light and accommodation. No scleral icterus. Extraocular muscles intact. Pallor+HEENT: Head atraumatic, normocephalic. Oropharynx and nasopharynx clear.  NECK:  Supple, no jugular venous distention. No thyroid enlargement, no tenderness.  LUNGS: Normal breath sounds bilaterally, no wheezing, rales, rhonchi. No use of accessory muscles of respiration.  CARDIOVASCULAR: S1, S2 normal. No murmurs, rubs, or gallops.  ABDOMEN: Soft, nontender, nondistended. Bowel sounds present. No organomegaly or mass.  EXTREMITIES: No cyanosis, clubbing or edema b/l.    NEUROLOGIC: moves all extremities well  PSYCHIATRIC:  patient is alert . Has dementia SKIN: No obvious rash, lesion, or ulcer.   LABORATORY PANEL:  CBC Recent Labs  Lab 03/31/17 0408  WBC 6.3  HGB 8.8*  HCT 26.5*  PLT 321    Chemistries  Recent Labs  Lab 03/30/17 1236 03/31/17 0408  NA 140 140  K 3.9 3.7  CL 111 115*  CO2 20* 19*  GLUCOSE 118* 98  BUN 24* 18  CREATININE 1.05* 0.88  CALCIUM 8.4* 7.9*  AST 23  --   ALT 12*  --   ALKPHOS 163*  --   BILITOT 0.6  --     Cardiac Enzymes Recent Labs  Lab 03/30/17 1340  TROPONINI <0.03   RADIOLOGY:  No results found. ASSESSMENT AND PLAN:  Morgan Escobar is a 83 y.o. female , not anticoagluated, w/ dementia and hx of GI bleed presenting w/ anemia, pallor. The patient is unable to give any history, but the report is that she has had some significant pallor and had blood work done as an outpatient which showed a drop in hematocrit   1.  GI bleed appears slow likely upper -Came in with hemoglobin of 5.9---- 4 units blood transfusion--8.8 -Patient taking NSAIDs on a daily basis rule out peptic ulcer disease -Spoke with son Morgan Escobar got consent for EGD over the phone -Appreciate GI input.  Plans of EGD noted -IV Protonix drip  2.  Advanced dementia  3.  DVT prophylaxis SCD teds  Case discussed with Care Management/Social Worker. Management plans discussed with the patient, family and they are in agreement.  CODE STATUS: DNR  DVT Prophylaxis: SCD  TOTAL TIME TAKING CARE OF THIS PATIENT: **25 minutes.  >50% time spent on counselling and coordination of care  POSSIBLE D/C IN 1-2 DAYS, DEPENDING ON CLINICAL CONDITION.  Note: This dictation was prepared with Dragon dictation along with smaller phrase technology. Any transcriptional errors that result from this process are unintentional.  Fritzi Mandes M.D on 03/31/2017 at 10:17 AM  Between 7am to 6pm - Pager -  [phone]  After 6pm go to [email or web address] - password [email or web address]  [facility]  Office  [phone]  CC: Primary care physician; Venia Carbon, MDPatient ID: Morgan Escobar, female   DOB: Jul 22, 1927, 83 y.o.   MRN: [id number]

## 2017-04-01 MED ORDER — PANTOPRAZOLE SODIUM 40 MG PO TBEC: 40.0000 mg | DELAYED_RELEASE_TABLET | Freq: Two times a day (BID) | ORAL | Status: DC

## 2017-04-01 MED ORDER — PANTOPRAZOLE SODIUM 40 MG PO TBEC: 40.0000 mg | DELAYED_RELEASE_TABLET | Freq: Two times a day (BID) | ORAL | 1 refills | Status: AC

## 2017-04-01 NOTE — Care Management Important Message (Signed)
Important Message  Patient Details  Name: Kimbery Harwood MRN: [id number] Date of Birth: 09-16-1927   Medicare Important Message Given:  Other (see comment) There is no signed notice in Epic from admission.  Patient is not able to understand notice or sign.  Primary nurse reported son was on the unit and aware of discharge and that he was in agreement.    Katrina Stack, RN 04/01/2017, 12:41 PM

## 2017-04-01 NOTE — Clinical Social Work Note (Signed)
The patient will discharge today via non-emergent EMS to [facility] ALF/MCU. The patient's family is aware and in agreement. CSW will continue to follow pending additional discharge needs.  Santiago Bumpers, MSW, Latanya Presser [phone]

## 2017-04-01 NOTE — Discharge Instructions (Signed)
Patient to be followed by speech therapy at facility.  She will be on dysphagia 3 nectar thick liquid diet.

## 2017-04-01 NOTE — Progress Notes (Signed)
Bedside swallow eval completed. Report to follow later today. Pt showed overt s/s of aspiration with thin liquids but tolerated nectar thick well. Plans for discharge later today. Rec dys 3 with nectar thick liquids. Allow Pt to feed herself. REC ST at discharge to [facility]

## 2017-04-01 NOTE — Discharge Summary (Signed)
[facility] [place] NAME: Morgan Escobar    MR#:  [id number]  DATE OF BIRTH:  Nov 30, 1927  DATE OF ADMISSION:  03/30/2017 ADMITTING PHYSICIAN: Demetrios Loll, MD  DATE OF DISCHARGE: 04/01/17  PRIMARY CARE PHYSICIAN: Venia Carbon, MD    ADMISSION DIAGNOSIS:  Gastrointestinal hemorrhage, unspecified gastrointestinal hemorrhage type [K92.2] Anemia, unspecified type [D64.9]  DISCHARGE DIAGNOSIS:  GI bleed due to Peptic ulcer disease (slow GI) due to chronic NSAID use Advance Dementia  SECONDARY DIAGNOSIS:   Past Medical History:  Diagnosis Date  . Anemia   . Dementia   . GERD (gastroesophageal reflux disease)   . Hyperlipidemia     HOSPITAL COURSE:   Roger Fasnacht a 83 y.o.female, not anticoagluated, w/ dementia and hx of GI bleed presenting w/ anemia, pallor.The patient is unable to give any history, but the report is that she has had some significant pallor and had blood work done as an outpatient which showed a drop in hematocrit   1.  GI bleed appears slow due to NSAID induced PUD -Came in with hemoglobin of 5.9---- 4 units blood transfusion--8.8 -Patient taking NSAIDs on a daily basis --d/ced it -Spoke with son Morgan Escobar and discussed EGD results over the phone -Appreciate GI input.  -EGD showed nonbleeding gastric ulcers. -Patient on IV Protonix drip--- p.o. Protonix twice daily  2.  Advanced dementia  3.  DVT prophylaxis SCD teds  4.  Overt signs of aspiration noted by speech therapy Recommended nectar thick dysphagia 3 diet.  Speech [facility] to [facility] at [facility]  Discharge today.   CONSULTS OBTAINED:  Treatment Team:  Jonathon Bellows, MD  DRUG ALLERGIES:   Allergies  Allergen Reactions  . Levaquin [Levofloxacin In D5w]     DISCHARGE MEDICATIONS:   Allergies as of 04/01/2017      Reactions   Levaquin [levofloxacin In D5w]       Medication List    STOP taking these medications    naproxen 500 MG tablet Commonly known as:  NAPROSYN     TAKE these medications   acetaminophen 325 MG tablet Commonly known as:  TYLENOL Take 325 mg by mouth every 6 (six) hours as needed.   famotidine 20 MG tablet Commonly known as:  PEPCID Take 20 mg by mouth daily.   HYDROcodone-acetaminophen 5-325 MG tablet Commonly known as:  NORCO/VICODIN Take 1 tablet by mouth every morning.   Lidocaine 4 % Ptch Apply 0.5 patches topically every 12 (twelve) hours. Apply to knees   pantoprazole 40 MG tablet Commonly known as:  PROTONIX Take 1 tablet (40 mg total) by mouth 2 (two) times daily.   PARoxetine 10 MG tablet Commonly known as:  PAXIL Take 10 mg by mouth daily.   polyethylene glycol packet Commonly known as:  MIRALAX / GLYCOLAX Take 17 g by mouth every other day.   sennosides 8.8 MG/5ML syrup Commonly known as:  SENOKOT Take 5 mLs by mouth every other day.   Vitamin D (Ergocalciferol) 50000 units Caps capsule Commonly known as:  DRISDOL Take 50,000 Units by mouth every 30 (thirty) days.       If you experience worsening of your admission symptoms, develop shortness of breath, life threatening emergency, suicidal or homicidal thoughts you must seek medical attention immediately by calling 911 or calling your MD immediately  if symptoms less severe.  You Must read complete instructions/literature along with all the possible adverse reactions/side effects for all the Medicines you  take and that have been prescribed to you. Take any new Medicines after you have completely understood and accept all the possible adverse reactions/side effects.   Please note  You were cared for by a hospitalist during your hospital stay. If you have any questions about your discharge medications or the care you received while you were in the hospital after you are discharged, you can call the unit and asked to speak with the hospitalist on call if the hospitalist that took care of you is not  available. Once you are discharged, your primary care physician will handle any further medical issues. Please note that NO REFILLS for any discharge medications will be authorized once you are discharged, as it is imperative that you return to your primary care physician (or establish a relationship with a primary care physician if you do not have one) for your aftercare needs so that they can reassess your need for medications and monitor your lab values. Today   SUBJECTIVE   The patient has dementia.  Unable to give any review of system.  Has been on eventful day per RN  VITAL SIGNS:  Blood pressure (!) 105/23, pulse 88, temperature 98.3 F (36.8 C), temperature source Oral, resp. rate 14, height 5\' 2"  (1.575 m), weight 59 kg (130 lb), SpO2 100 %.  I/O:    Intake/Output Summary (Last 24 hours) at 04/01/2017 1146 Last data filed at 04/01/2017 1009 Gross per 24 hour  Intake 1759 ml  Output 0 ml  Net 1759 ml    PHYSICAL EXAMINATION:  GENERAL:  83 y.o.-year-old patient lying in the bed with no acute distress.  EYES: Pupils equal, round, reactive to light and accommodation. No scleral icterus. Extraocular muscles intact.  HEENT: Head atraumatic, normocephalic. Oropharynx and nasopharynx clear.  NECK:  Supple, no jugular venous distention. No thyroid enlargement, no tenderness.  LUNGS: Normal breath sounds bilaterally, no wheezing, rales,rhonchi or crepitation. No use of accessory muscles of respiration.  CARDIOVASCULAR: S1, S2 normal. No murmurs, rubs, or gallops.  ABDOMEN: Soft, non-tender, non-distended. Bowel sounds present. No organomegaly or mass.  EXTREMITIES: No pedal edema, cyanosis, or clubbing.  NEUROLOGIC: moves all extremities well PSYCHIATRIC: The patient is alert awake. Baseline dementia  SKIN: No obvious rash, lesion, or ulcer.   DATA REVIEW:   CBC  Recent Labs  Lab 03/31/17 0408  WBC 6.3  HGB 8.8*  HCT 26.5*  PLT 321    Chemistries  Recent Labs  Lab  03/30/17 1236 03/31/17 0408  NA 140 140  K 3.9 3.7  CL 111 115*  CO2 20* 19*  GLUCOSE 118* 98  BUN 24* 18  CREATININE 1.05* 0.88  CALCIUM 8.4* 7.9*  AST 23  --   ALT 12*  --   ALKPHOS 163*  --   BILITOT 0.6  --     Microbiology Results   Recent Results (from the past 240 hour(s))  Surgical PCR screen     Status: Abnormal   Collection Time: 03/30/17  6:34 PM  Result Value Ref Range Status   MRSA, PCR NEGATIVE NEGATIVE Final   Staphylococcus aureus POSITIVE (A) NEGATIVE Final    Comment: (NOTE) The Xpert SA Assay (FDA approved for NASAL specimens in patients 31 years of age and older), is one component of a comprehensive surveillance program. It is not intended to diagnose infection nor to guide or monitor treatment.     RADIOLOGY:  No results found.   Management plans discussed with the patient, family and they  are in agreement.  CODE STATUS:     Code Status Orders  (From admission, onward)        Start     Ordered   03/30/17 1810  Do not attempt resuscitation (DNR)  Continuous    Question Answer Comment  In the event of cardiac or respiratory ARREST Do not call a "code blue"   In the event of cardiac or respiratory ARREST Do not perform Intubation, CPR, defibrillation or ACLS   In the event of cardiac or respiratory ARREST Use medication by any route, position, wound care, and other measures to relive pain and suffering. May use oxygen, suction and manual treatment of airway obstruction as needed for comfort.      03/30/17 1809    Code Status History    Date Active Date Inactive Code Status Order ID Comments User Context   This patient has a current code status but no historical code status.    Advance Directive Documentation     Most Recent Value  Type of Advance Directive  Out of facility DNR (pink MOST or yellow form)  Pre-existing out of facility DNR order (yellow form or pink MOST form)  Yellow form placed in chart (order not valid for inpatient  use)  "MOST" Form in Place?  No data      TOTAL TIME TAKING CARE OF THIS PATIENT: *40* minutes.    Fritzi Mandes M.D on 04/01/2017 at 11:46 AM  Between 7am to 6pm - Pager - [phone] After 6pm go to [email or web address] - password [email or web address] [facility]  Office  [phone]  CC: Primary care physician; Venia Carbon, MD

## 2017-04-01 NOTE — Evaluation (Signed)
Clinical/Bedside Swallow Evaluation Patient Details  Name: Morgan Escobar MRN: [id number] Date of Birth: Mar 13, 1928  Today's Date: 04/01/2017 Time: SLP Start Time (ACUTE ONLY): 1100 SLP Stop Time (ACUTE ONLY): 1132 SLP Time Calculation (min) (ACUTE ONLY): 32 min  Past Medical History:  Past Medical History:  Diagnosis Date  . Anemia   . Dementia   . GERD (gastroesophageal reflux disease)   . Hyperlipidemia    Past Surgical History: History reviewed. No pertinent surgical history. HPI:   Morgan Escobar  is a 83 y.o. female with a known history of anemia, dementia, GERD and hyperlipidemia. She resides [facility] care facility. She has plans for discharge today but was noted to show some dysphagia with thin liquids and MD has requested a swallow eval prior to discharge.     Assessment / Plan / Recommendation Clinical Impression  Pt. presents with immediate overt s/s of aspiration with thin liquids. Oral mech exam revealed structures to be functioning adequately for consistencies tested. When presented with food from a spoon given by SLP, Pt barely opened her mouth and took very timy bites. However, with encouragment and cues, she was able to feed herself and take normal sized bites. Pt tolerated sips of nectar thick and honey thick liquids but presented with an immediate cough after one of two sips of thin water. Mastication of soft solids was adequate when Pt fed herself. Min to no oral residue after the swallow. Rec Dys 3 diet with nectar thick liquids. Allow Pt to feed herself with cueing or redirection when needed. Pt is being discharged to [facility] today. Rec ST eval and tx at discharge to further assess with new diet and alter if indicated. SLP Visit Diagnosis: Dysphagia, oropharyngeal phase (R13.12)    Aspiration Risk       Diet Recommendation Dysphagia 3 (Mech soft);Nectar-thick liquid   Medication Administration: Whole meds with puree Supervision: Staff to assist with  self feeding    Other  Recommendations     Follow up Recommendations        Frequency and Duration            Prognosis        Swallow Study   General Date of Onset: 04/01/17 Type of Study: Bedside Swallow Evaluation Temperature Spikes Noted: No Respiratory Status: Room air History of Recent Intubation: No Behavior/Cognition: Alert;Cooperative;Pleasant mood Oral Cavity Assessment: Within Functional Limits Oral Care Completed by SLP: No Oral Cavity - Dentition: Adequate natural dentition Self-Feeding Abilities: Needs set up Patient Positioning: Upright in bed    Oral/Motor/Sensory Function Overall Oral Motor/Sensory Function: Within functional limits   Ice Chips     Thin Liquid Thin Liquid: Impaired Presentation: Cup Pharyngeal  Phase Impairments: Cough - Immediate    Nectar Thick Nectar Thick Liquid: Within functional limits Presentation: Self Fed   Honey Thick Honey Thick Liquid: Within functional limits Presentation: Self fed   Puree Puree: Within functional limits Presentation: Spoon   Solid   GO   Solid: Within functional limits Presentation: [place] 04/01/2017,1:36 PM

## 2017-04-01 NOTE — NC FL2 (Signed)
[place] LEVEL OF CARE SCREENING TOOL     IDENTIFICATION  Patient Name: Morgan Escobar Birthdate: 15-Dec-1927 Sex: female Admission Date (Current Location): 03/30/2017  [facility] and [place] Number:  Engineering geologist and Address:  [facility], [address] [place], [place] [address]      Provider Number: [id number]  Attending Physician Name and Address:  Fritzi Mandes, MD  Relative Name and Phone Number:       Current Level of Care: Hospital Recommended Level of Care: Memory Care Prior Approval Number:    Date Approved/Denied:   PASRR Number:    Discharge Plan: Domiciliary (Rest home)    Current Diagnoses: Patient Active Problem List   Diagnosis Date Noted  . Acute posthemorrhagic anemia   . Acute gastric ulcer without hemorrhage or perforation   . Stomach irritation   . GIB (gastrointestinal bleeding) 03/30/2017    Orientation RESPIRATION BLADDER Height & Weight     Self  Normal Incontinent Weight: 130 lb (59 kg) Height:  5\' 2"  (157.5 cm)  BEHAVIORAL SYMPTOMS/MOOD NEUROLOGICAL BOWEL NUTRITION STATUS      Incontinent Diet(dys 3 with nectar thick liquids)  AMBULATORY STATUS COMMUNICATION OF NEEDS Skin   Supervision Verbally Normal                       Personal Care Assistance Level of Assistance  Bathing, Feeding, Dressing Bathing Assistance: Limited assistance Feeding assistance: Independent Dressing Assistance: Limited assistance     Functional Limitations Info             SPECIAL CARE FACTORS FREQUENCY  Speech therapy                    Contractures Contractures Info: Not present    Additional Factors Info  Code Status, Allergies Code Status Info: DNR Allergies Info: Levaquin Levofloxacin            Current Medications (04/01/2017):  This is the current hospital active medication list Current Facility-Administered Medications  Medication Dose Route Frequency Provider Last Rate Last  Dose  . acetaminophen (TYLENOL) tablet 650 mg  650 mg Oral Q6H PRN Demetrios Loll, MD       Or  . acetaminophen (TYLENOL) suppository 650 mg  650 mg Rectal Q6H PRN Demetrios Loll, MD      . albuterol (PROVENTIL) (2.5 MG/3ML) 0.083% nebulizer solution 2.5 mg  2.5 mg Nebulization Q2H PRN Demetrios Loll, MD      . Chlorhexidine Gluconate Cloth 2 % PADS 6 each  6 each Topical Daily Demetrios Loll, MD      . hydrALAZINE (APRESOLINE) injection 10 mg  10 mg Intravenous Q4H PRN Lance Coon, MD   10 mg at 03/31/17 1300  . HYDROcodone-acetaminophen (NORCO/VICODIN) 5-325 MG per tablet 1 tablet  1 tablet Oral Beaulah Dinning, MD   1 tablet at 04/01/17 0604  . mupirocin ointment (BACTROBAN) 2 % 1 application  1 application Nasal BID Demetrios Loll, MD   1 application at 95/62/13 1128  . ondansetron (ZOFRAN) tablet 4 mg  4 mg Oral Q6H PRN Demetrios Loll, MD       Or  . ondansetron [facility]) injection 4 mg  4 mg Intravenous Q6H PRN Demetrios Loll, MD      . pantoprazole (PROTONIX) EC tablet 40 mg  40 mg Oral BID Fritzi Mandes, MD      . PARoxetine (PAXIL) tablet 10 mg  10 mg Oral Daily Demetrios Loll, MD  10 mg at 04/01/17 1128     Discharge Medications: Medication List     STOP taking these medications   naproxen 500 MG tablet Commonly known as:  NAPROSYN     TAKE these medications   acetaminophen 325 MG tablet Commonly known as:  TYLENOL Take 325 mg by mouth every 6 (six) hours as needed.   famotidine 20 MG tablet Commonly known as:  PEPCID Take 20 mg by mouth daily.   HYDROcodone-acetaminophen 5-325 MG tablet Commonly known as:  NORCO/VICODIN Take 1 tablet by mouth every morning.   Lidocaine 4 % Ptch Apply 0.5 patches topically every 12 (twelve) hours. Apply to knees   pantoprazole 40 MG tablet Commonly known as:  PROTONIX Take 1 tablet (40 mg total) by mouth 2 (two) times daily.   PARoxetine 10 MG tablet Commonly known as:  PAXIL Take 10 mg by mouth daily.   polyethylene glycol packet Commonly  known as:  MIRALAX / GLYCOLAX Take 17 g by mouth every other day.   sennosides 8.8 MG/5ML syrup Commonly known as:  SENOKOT Take 5 mLs by mouth every other day.   Vitamin D (Ergocalciferol) 50000 units Caps capsule Commonly known as:  DRISDOL Take 50,000 Units by mouth every 30 (thirty) days.     Relevant Imaging Results:  Relevant Lab Results:   Additional Information  Primary diagnosis is dementia.  Zettie Pho, LCSW

## 2017-04-02 LAB — BPAM RBC
BLOOD PRODUCT EXPIRATION DATE: 201901052359
BLOOD PRODUCT EXPIRATION DATE: 201901072359
BLOOD PRODUCT EXPIRATION DATE: 201901072359
Blood Product Expiration Date: 201901052359
ISSUE DATE / TIME: 201812131809
ISSUE DATE / TIME: 201812132108
UNIT TYPE AND RH: 5100
UNIT TYPE AND RH: 5100
Unit Type and Rh: 5100
Unit Type and Rh: 5100

## 2017-04-02 LAB — TYPE AND SCREEN
ABO/RH(D): O POS
ANTIBODY SCREEN: NEGATIVE
UNIT DIVISION: 0
UNIT DIVISION: 0
Unit division: 0
Unit division: 0

## 2017-04-02 LAB — PREPARE RBC (CROSSMATCH)

## 2017-04-03 NOTE — Anesthesia Postprocedure Evaluation (Addendum)
Anesthesia Post Note  Patient: Tahlor Berenguer  Procedure(s) Performed: ESOPHAGOGASTRODUODENOSCOPY (EGD) (N/A )  Patient location during evaluation: Endoscopy Anesthesia Type: General Level of consciousness: awake and alert Pain management: pain level controlled Vital Signs Assessment: post-procedure vital signs reviewed and stable Respiratory status: spontaneous breathing, nonlabored ventilation and respiratory function stable Cardiovascular status: blood pressure returned to baseline and stable Postop Assessment: no signs of nausea or vomiting Anesthetic complications: no     Last Vitals:  Vitals:   04/01/17 1439 04/01/17 1931  BP: (!) 125/49 (!) 129/55  Pulse: 97 98  Resp: 20 18  Temp: 37.2 C 37.4 C  SpO2: 97% 98%    Last Pain:  Vitals:   04/01/17 1931  TempSrc: Oral  PainSc:                  Makenah Karas

## 2017-04-04 ENCOUNTER — Encounter: Payer: Self-pay | Admitting: Gastroenterology

## 2017-04-20 DIAGNOSIS — D649 Anemia, unspecified: Secondary | ICD-10-CM | POA: Diagnosis not present

## 2017-05-24 DIAGNOSIS — I2510 Atherosclerotic heart disease of native coronary artery without angina pectoris: Secondary | ICD-10-CM | POA: Diagnosis not present

## 2017-05-24 DIAGNOSIS — K219 Gastro-esophageal reflux disease without esophagitis: Secondary | ICD-10-CM | POA: Diagnosis not present

## 2017-05-24 DIAGNOSIS — G309 Alzheimer's disease, unspecified: Secondary | ICD-10-CM | POA: Diagnosis not present

## 2017-05-24 DIAGNOSIS — M1990 Unspecified osteoarthritis, unspecified site: Secondary | ICD-10-CM | POA: Diagnosis not present

## 2017-05-25 DIAGNOSIS — D649 Anemia, unspecified: Secondary | ICD-10-CM | POA: Diagnosis not present

## 2017-07-12 DIAGNOSIS — R222 Localized swelling, mass and lump, trunk: Secondary | ICD-10-CM | POA: Diagnosis not present

## 2017-07-17 DIAGNOSIS — C44519 Basal cell carcinoma of skin of other part of trunk: Secondary | ICD-10-CM | POA: Diagnosis not present

## 2017-07-21 DIAGNOSIS — G309 Alzheimer's disease, unspecified: Secondary | ICD-10-CM | POA: Diagnosis not present

## 2017-07-21 DIAGNOSIS — K219 Gastro-esophageal reflux disease without esophagitis: Secondary | ICD-10-CM | POA: Diagnosis not present

## 2017-07-21 DIAGNOSIS — F419 Anxiety disorder, unspecified: Secondary | ICD-10-CM | POA: Diagnosis not present

## 2017-07-21 DIAGNOSIS — I2510 Atherosclerotic heart disease of native coronary artery without angina pectoris: Secondary | ICD-10-CM | POA: Diagnosis not present

## 2017-07-21 DIAGNOSIS — M1990 Unspecified osteoarthritis, unspecified site: Secondary | ICD-10-CM | POA: Diagnosis not present

## 2017-08-01 DIAGNOSIS — L905 Scar conditions and fibrosis of skin: Secondary | ICD-10-CM | POA: Diagnosis not present

## 2017-09-29 DIAGNOSIS — I2510 Atherosclerotic heart disease of native coronary artery without angina pectoris: Secondary | ICD-10-CM | POA: Diagnosis not present

## 2017-09-29 DIAGNOSIS — K219 Gastro-esophageal reflux disease without esophagitis: Secondary | ICD-10-CM | POA: Diagnosis not present

## 2017-09-29 DIAGNOSIS — G309 Alzheimer's disease, unspecified: Secondary | ICD-10-CM | POA: Diagnosis not present

## 2017-09-29 DIAGNOSIS — F419 Anxiety disorder, unspecified: Secondary | ICD-10-CM | POA: Diagnosis not present

## 2017-09-29 DIAGNOSIS — M1990 Unspecified osteoarthritis, unspecified site: Secondary | ICD-10-CM | POA: Diagnosis not present

## 2017-10-30 DIAGNOSIS — D649 Anemia, unspecified: Secondary | ICD-10-CM | POA: Diagnosis not present

## 2017-11-22 DIAGNOSIS — G309 Alzheimer's disease, unspecified: Secondary | ICD-10-CM

## 2017-11-22 DIAGNOSIS — F411 Generalized anxiety disorder: Secondary | ICD-10-CM

## 2017-11-22 DIAGNOSIS — I2510 Atherosclerotic heart disease of native coronary artery without angina pectoris: Secondary | ICD-10-CM

## 2017-11-22 DIAGNOSIS — M1990 Unspecified osteoarthritis, unspecified site: Secondary | ICD-10-CM

## 2017-11-22 DIAGNOSIS — K219 Gastro-esophageal reflux disease without esophagitis: Secondary | ICD-10-CM

## 2018-01-19 DIAGNOSIS — K219 Gastro-esophageal reflux disease without esophagitis: Secondary | ICD-10-CM | POA: Diagnosis not present

## 2018-01-19 DIAGNOSIS — G309 Alzheimer's disease, unspecified: Secondary | ICD-10-CM | POA: Diagnosis not present

## 2018-01-19 DIAGNOSIS — M1990 Unspecified osteoarthritis, unspecified site: Secondary | ICD-10-CM | POA: Diagnosis not present

## 2018-01-19 DIAGNOSIS — I2510 Atherosclerotic heart disease of native coronary artery without angina pectoris: Secondary | ICD-10-CM | POA: Diagnosis not present

## 2018-01-19 DIAGNOSIS — F419 Anxiety disorder, unspecified: Secondary | ICD-10-CM | POA: Diagnosis not present

## 2018-02-22 DIAGNOSIS — D649 Anemia, unspecified: Secondary | ICD-10-CM | POA: Diagnosis not present

## 2018-03-26 DIAGNOSIS — S8002XA Contusion of left knee, initial encounter: Secondary | ICD-10-CM | POA: Diagnosis not present

## 2018-03-30 DIAGNOSIS — K219 Gastro-esophageal reflux disease without esophagitis: Secondary | ICD-10-CM | POA: Diagnosis not present

## 2018-03-30 DIAGNOSIS — M1990 Unspecified osteoarthritis, unspecified site: Secondary | ICD-10-CM | POA: Diagnosis not present

## 2018-03-30 DIAGNOSIS — I10 Essential (primary) hypertension: Secondary | ICD-10-CM | POA: Diagnosis not present

## 2018-03-30 DIAGNOSIS — G309 Alzheimer's disease, unspecified: Secondary | ICD-10-CM | POA: Diagnosis not present

## 2018-03-30 DIAGNOSIS — F419 Anxiety disorder, unspecified: Secondary | ICD-10-CM | POA: Diagnosis not present

## 2018-03-30 DIAGNOSIS — I2510 Atherosclerotic heart disease of native coronary artery without angina pectoris: Secondary | ICD-10-CM | POA: Diagnosis not present

## 2018-05-25 DIAGNOSIS — I2510 Atherosclerotic heart disease of native coronary artery without angina pectoris: Secondary | ICD-10-CM | POA: Diagnosis not present

## 2018-05-25 DIAGNOSIS — G309 Alzheimer's disease, unspecified: Secondary | ICD-10-CM | POA: Diagnosis not present

## 2018-05-25 DIAGNOSIS — F419 Anxiety disorder, unspecified: Secondary | ICD-10-CM

## 2018-05-25 DIAGNOSIS — K219 Gastro-esophageal reflux disease without esophagitis: Secondary | ICD-10-CM | POA: Diagnosis not present

## 2018-05-25 DIAGNOSIS — M1990 Unspecified osteoarthritis, unspecified site: Secondary | ICD-10-CM | POA: Diagnosis not present

## 2018-08-03 DIAGNOSIS — I2510 Atherosclerotic heart disease of native coronary artery without angina pectoris: Secondary | ICD-10-CM

## 2018-08-03 DIAGNOSIS — M1990 Unspecified osteoarthritis, unspecified site: Secondary | ICD-10-CM

## 2018-08-03 DIAGNOSIS — F419 Anxiety disorder, unspecified: Secondary | ICD-10-CM

## 2018-08-03 DIAGNOSIS — G309 Alzheimer's disease, unspecified: Secondary | ICD-10-CM

## 2018-08-03 DIAGNOSIS — K219 Gastro-esophageal reflux disease without esophagitis: Secondary | ICD-10-CM

## 2018-09-26 DIAGNOSIS — K219 Gastro-esophageal reflux disease without esophagitis: Secondary | ICD-10-CM | POA: Diagnosis not present

## 2018-09-26 DIAGNOSIS — M1990 Unspecified osteoarthritis, unspecified site: Secondary | ICD-10-CM | POA: Diagnosis not present

## 2018-09-26 DIAGNOSIS — I2510 Atherosclerotic heart disease of native coronary artery without angina pectoris: Secondary | ICD-10-CM | POA: Diagnosis not present

## 2018-09-26 DIAGNOSIS — G309 Alzheimer's disease, unspecified: Secondary | ICD-10-CM | POA: Diagnosis not present

## 2018-09-26 DIAGNOSIS — F411 Generalized anxiety disorder: Secondary | ICD-10-CM | POA: Diagnosis not present

## 2018-11-16 ENCOUNTER — Other Ambulatory Visit: Payer: Self-pay

## 2018-11-21 DIAGNOSIS — I2510 Atherosclerotic heart disease of native coronary artery without angina pectoris: Secondary | ICD-10-CM

## 2018-11-21 DIAGNOSIS — G309 Alzheimer's disease, unspecified: Secondary | ICD-10-CM

## 2018-11-21 DIAGNOSIS — F419 Anxiety disorder, unspecified: Secondary | ICD-10-CM

## 2018-11-21 DIAGNOSIS — K219 Gastro-esophageal reflux disease without esophagitis: Secondary | ICD-10-CM

## 2018-11-21 DIAGNOSIS — M1990 Unspecified osteoarthritis, unspecified site: Secondary | ICD-10-CM

## 2019-01-23 DIAGNOSIS — F419 Anxiety disorder, unspecified: Secondary | ICD-10-CM | POA: Diagnosis not present

## 2019-01-23 DIAGNOSIS — M1990 Unspecified osteoarthritis, unspecified site: Secondary | ICD-10-CM | POA: Diagnosis not present

## 2019-01-23 DIAGNOSIS — K219 Gastro-esophageal reflux disease without esophagitis: Secondary | ICD-10-CM | POA: Diagnosis not present

## 2019-01-23 DIAGNOSIS — I2510 Atherosclerotic heart disease of native coronary artery without angina pectoris: Secondary | ICD-10-CM | POA: Diagnosis not present

## 2019-01-23 DIAGNOSIS — G309 Alzheimer's disease, unspecified: Secondary | ICD-10-CM | POA: Diagnosis not present

## 2019-02-21 DIAGNOSIS — D649 Anemia, unspecified: Secondary | ICD-10-CM | POA: Diagnosis not present

## 2019-03-13 DIAGNOSIS — H02109 Unspecified ectropion of unspecified eye, unspecified eyelid: Secondary | ICD-10-CM | POA: Diagnosis not present

## 2019-03-13 DIAGNOSIS — B309 Viral conjunctivitis, unspecified: Secondary | ICD-10-CM | POA: Diagnosis not present

## 2019-03-20 DIAGNOSIS — F419 Anxiety disorder, unspecified: Secondary | ICD-10-CM

## 2019-03-20 DIAGNOSIS — I2510 Atherosclerotic heart disease of native coronary artery without angina pectoris: Secondary | ICD-10-CM | POA: Diagnosis not present

## 2019-03-20 DIAGNOSIS — M1990 Unspecified osteoarthritis, unspecified site: Secondary | ICD-10-CM | POA: Diagnosis not present

## 2019-03-20 DIAGNOSIS — K219 Gastro-esophageal reflux disease without esophagitis: Secondary | ICD-10-CM | POA: Diagnosis not present

## 2019-03-20 DIAGNOSIS — G309 Alzheimer's disease, unspecified: Secondary | ICD-10-CM | POA: Diagnosis not present

## 2019-04-28 DIAGNOSIS — Z23 Encounter for immunization: Secondary | ICD-10-CM | POA: Diagnosis not present

## 2019-05-22 DIAGNOSIS — G309 Alzheimer's disease, unspecified: Secondary | ICD-10-CM | POA: Diagnosis not present

## 2019-05-22 DIAGNOSIS — K219 Gastro-esophageal reflux disease without esophagitis: Secondary | ICD-10-CM | POA: Diagnosis not present

## 2019-05-22 DIAGNOSIS — I2510 Atherosclerotic heart disease of native coronary artery without angina pectoris: Secondary | ICD-10-CM | POA: Diagnosis not present

## 2019-05-22 DIAGNOSIS — M1990 Unspecified osteoarthritis, unspecified site: Secondary | ICD-10-CM | POA: Diagnosis not present

## 2019-05-26 DIAGNOSIS — Z23 Encounter for immunization: Secondary | ICD-10-CM | POA: Diagnosis not present

## 2019-08-02 DIAGNOSIS — K219 Gastro-esophageal reflux disease without esophagitis: Secondary | ICD-10-CM

## 2019-08-02 DIAGNOSIS — F419 Anxiety disorder, unspecified: Secondary | ICD-10-CM

## 2019-08-02 DIAGNOSIS — G309 Alzheimer's disease, unspecified: Secondary | ICD-10-CM

## 2019-08-02 DIAGNOSIS — I2510 Atherosclerotic heart disease of native coronary artery without angina pectoris: Secondary | ICD-10-CM

## 2019-08-02 DIAGNOSIS — M1990 Unspecified osteoarthritis, unspecified site: Secondary | ICD-10-CM

## 2019-08-02 DIAGNOSIS — K649 Unspecified hemorrhoids: Secondary | ICD-10-CM

## 2019-08-02 DIAGNOSIS — R634 Abnormal weight loss: Secondary | ICD-10-CM

## 2019-09-27 DIAGNOSIS — K219 Gastro-esophageal reflux disease without esophagitis: Secondary | ICD-10-CM | POA: Diagnosis not present

## 2019-09-27 DIAGNOSIS — M1990 Unspecified osteoarthritis, unspecified site: Secondary | ICD-10-CM | POA: Diagnosis not present

## 2019-09-27 DIAGNOSIS — F323 Major depressive disorder, single episode, severe with psychotic features: Secondary | ICD-10-CM | POA: Diagnosis not present

## 2019-09-27 DIAGNOSIS — G309 Alzheimer's disease, unspecified: Secondary | ICD-10-CM | POA: Diagnosis not present

## 2019-09-27 DIAGNOSIS — E43 Unspecified severe protein-calorie malnutrition: Secondary | ICD-10-CM | POA: Diagnosis not present

## 2019-09-27 DIAGNOSIS — I2510 Atherosclerotic heart disease of native coronary artery without angina pectoris: Secondary | ICD-10-CM | POA: Diagnosis not present

## 2019-12-17 DIAGNOSIS — R05 Cough: Secondary | ICD-10-CM | POA: Diagnosis not present

## 2019-12-17 DIAGNOSIS — R0902 Hypoxemia: Secondary | ICD-10-CM | POA: Diagnosis not present

## 2019-12-18 DIAGNOSIS — R0902 Hypoxemia: Secondary | ICD-10-CM | POA: Diagnosis not present

## 2019-12-18 DIAGNOSIS — R918 Other nonspecific abnormal finding of lung field: Secondary | ICD-10-CM | POA: Diagnosis not present

## 2019-12-19 DIAGNOSIS — J181 Lobar pneumonia, unspecified organism: Secondary | ICD-10-CM | POA: Diagnosis not present

## 2020-01-17 DIAGNOSIS — K219 Gastro-esophageal reflux disease without esophagitis: Secondary | ICD-10-CM | POA: Diagnosis not present

## 2020-01-17 DIAGNOSIS — R0682 Tachypnea, not elsewhere classified: Secondary | ICD-10-CM | POA: Diagnosis not present

## 2020-01-17 DIAGNOSIS — F0280 Dementia in other diseases classified elsewhere without behavioral disturbance: Secondary | ICD-10-CM | POA: Diagnosis not present

## 2020-01-17 DIAGNOSIS — D649 Anemia, unspecified: Secondary | ICD-10-CM | POA: Diagnosis not present

## 2020-01-17 DIAGNOSIS — G309 Alzheimer's disease, unspecified: Secondary | ICD-10-CM | POA: Diagnosis not present

## 2020-01-17 DIAGNOSIS — I2510 Atherosclerotic heart disease of native coronary artery without angina pectoris: Secondary | ICD-10-CM | POA: Diagnosis not present

## 2020-01-17 DIAGNOSIS — R4181 Age-related cognitive decline: Secondary | ICD-10-CM | POA: Diagnosis not present

## 2020-01-17 DIAGNOSIS — F419 Anxiety disorder, unspecified: Secondary | ICD-10-CM | POA: Diagnosis not present

## 2020-01-17 DIAGNOSIS — E785 Hyperlipidemia, unspecified: Secondary | ICD-10-CM | POA: Diagnosis not present

## 2020-01-17 DIAGNOSIS — R509 Fever, unspecified: Secondary | ICD-10-CM | POA: Diagnosis not present

## 2020-01-17 DIAGNOSIS — R000 Tachycardia, unspecified: Secondary | ICD-10-CM | POA: Diagnosis not present

## 2020-01-18 DIAGNOSIS — D649 Anemia, unspecified: Secondary | ICD-10-CM | POA: Diagnosis not present

## 2020-01-18 DIAGNOSIS — F419 Anxiety disorder, unspecified: Secondary | ICD-10-CM | POA: Diagnosis not present

## 2020-01-18 DIAGNOSIS — F0280 Dementia in other diseases classified elsewhere without behavioral disturbance: Secondary | ICD-10-CM | POA: Diagnosis not present

## 2020-01-18 DIAGNOSIS — E785 Hyperlipidemia, unspecified: Secondary | ICD-10-CM | POA: Diagnosis not present

## 2020-01-18 DIAGNOSIS — G309 Alzheimer's disease, unspecified: Secondary | ICD-10-CM | POA: Diagnosis not present

## 2020-01-18 DIAGNOSIS — I2510 Atherosclerotic heart disease of native coronary artery without angina pectoris: Secondary | ICD-10-CM | POA: Diagnosis not present

## 2020-01-19 DIAGNOSIS — D649 Anemia, unspecified: Secondary | ICD-10-CM | POA: Diagnosis not present

## 2020-01-19 DIAGNOSIS — E785 Hyperlipidemia, unspecified: Secondary | ICD-10-CM | POA: Diagnosis not present

## 2020-01-19 DIAGNOSIS — I2510 Atherosclerotic heart disease of native coronary artery without angina pectoris: Secondary | ICD-10-CM | POA: Diagnosis not present

## 2020-01-19 DIAGNOSIS — F0280 Dementia in other diseases classified elsewhere without behavioral disturbance: Secondary | ICD-10-CM | POA: Diagnosis not present

## 2020-01-19 DIAGNOSIS — F419 Anxiety disorder, unspecified: Secondary | ICD-10-CM | POA: Diagnosis not present

## 2020-01-19 DIAGNOSIS — G309 Alzheimer's disease, unspecified: Secondary | ICD-10-CM | POA: Diagnosis not present

## 2020-01-20 DIAGNOSIS — F419 Anxiety disorder, unspecified: Secondary | ICD-10-CM | POA: Diagnosis not present

## 2020-01-20 DIAGNOSIS — F0280 Dementia in other diseases classified elsewhere without behavioral disturbance: Secondary | ICD-10-CM | POA: Diagnosis not present

## 2020-01-20 DIAGNOSIS — D649 Anemia, unspecified: Secondary | ICD-10-CM | POA: Diagnosis not present

## 2020-01-20 DIAGNOSIS — G309 Alzheimer's disease, unspecified: Secondary | ICD-10-CM | POA: Diagnosis not present

## 2020-01-20 DIAGNOSIS — I2510 Atherosclerotic heart disease of native coronary artery without angina pectoris: Secondary | ICD-10-CM | POA: Diagnosis not present

## 2020-01-20 DIAGNOSIS — E785 Hyperlipidemia, unspecified: Secondary | ICD-10-CM | POA: Diagnosis not present

## 2020-01-21 DIAGNOSIS — G309 Alzheimer's disease, unspecified: Secondary | ICD-10-CM | POA: Diagnosis not present

## 2020-01-21 DIAGNOSIS — F419 Anxiety disorder, unspecified: Secondary | ICD-10-CM | POA: Diagnosis not present

## 2020-01-21 DIAGNOSIS — F0280 Dementia in other diseases classified elsewhere without behavioral disturbance: Secondary | ICD-10-CM | POA: Diagnosis not present

## 2020-01-21 DIAGNOSIS — E785 Hyperlipidemia, unspecified: Secondary | ICD-10-CM | POA: Diagnosis not present

## 2020-01-21 DIAGNOSIS — I2510 Atherosclerotic heart disease of native coronary artery without angina pectoris: Secondary | ICD-10-CM | POA: Diagnosis not present

## 2020-01-21 DIAGNOSIS — D649 Anemia, unspecified: Secondary | ICD-10-CM | POA: Diagnosis not present

## 2020-01-22 DIAGNOSIS — G309 Alzheimer's disease, unspecified: Secondary | ICD-10-CM | POA: Diagnosis not present

## 2020-01-22 DIAGNOSIS — E785 Hyperlipidemia, unspecified: Secondary | ICD-10-CM | POA: Diagnosis not present

## 2020-01-22 DIAGNOSIS — F0280 Dementia in other diseases classified elsewhere without behavioral disturbance: Secondary | ICD-10-CM | POA: Diagnosis not present

## 2020-01-22 DIAGNOSIS — F419 Anxiety disorder, unspecified: Secondary | ICD-10-CM | POA: Diagnosis not present

## 2020-01-22 DIAGNOSIS — I2510 Atherosclerotic heart disease of native coronary artery without angina pectoris: Secondary | ICD-10-CM | POA: Diagnosis not present

## 2020-01-22 DIAGNOSIS — D649 Anemia, unspecified: Secondary | ICD-10-CM | POA: Diagnosis not present

## 2020-02-17 DEATH — deceased
